# Patient Record
Sex: Male | Born: 1965 | Race: White | Hispanic: No | Smoking: Former smoker
Health system: Southern US, Community
[De-identification: ages and names within clinical notes are randomized; demographics above are authoritative.]

## PROBLEM LIST (undated history)

## (undated) DIAGNOSIS — S52609A Unspecified fracture of lower end of unspecified ulna, initial encounter for closed fracture: Secondary | ICD-10-CM

## (undated) DIAGNOSIS — S069X9A Unspecified intracranial injury with loss of consciousness of unspecified duration, initial encounter: Secondary | ICD-10-CM

## (undated) DIAGNOSIS — S92009A Unspecified fracture of unspecified calcaneus, initial encounter for closed fracture: Secondary | ICD-10-CM

## (undated) DIAGNOSIS — S2239XA Fracture of one rib, unspecified side, initial encounter for closed fracture: Secondary | ICD-10-CM

## (undated) DIAGNOSIS — S52509A Unspecified fracture of the lower end of unspecified radius, initial encounter for closed fracture: Secondary | ICD-10-CM

## (undated) HISTORY — PX: EYE SURGERY: SHX253

## (undated) HISTORY — PX: OTHER SURGICAL HISTORY: SHX169

## (undated) HISTORY — PX: NASAL FRACTURE SURGERY: SHX718

---

## 2011-12-13 ENCOUNTER — Inpatient Hospital Stay (HOSPITAL_COMMUNITY)
Admission: EM | Admit: 2011-12-13 | Discharge: 2011-12-15 | DRG: 086 | Disposition: A | Discharge status: Home / Self Care | Payer: Self-pay

## 2011-12-13 ENCOUNTER — Emergency Department (HOSPITAL_COMMUNITY): Payer: Self-pay

## 2011-12-13 ENCOUNTER — Encounter (HOSPITAL_COMMUNITY): Payer: Self-pay | Admitting: *Deleted

## 2011-12-13 DIAGNOSIS — S069X9A Unspecified intracranial injury with loss of consciousness of unspecified duration, initial encounter: Secondary | ICD-10-CM

## 2011-12-13 DIAGNOSIS — S0990XA Unspecified injury of head, initial encounter: Secondary | ICD-10-CM

## 2011-12-13 DIAGNOSIS — Y93H3 Activity, building and construction: Secondary | ICD-10-CM

## 2011-12-13 DIAGNOSIS — S2241XA Multiple fractures of ribs, right side, initial encounter for closed fracture: Secondary | ICD-10-CM | POA: Diagnosis present

## 2011-12-13 DIAGNOSIS — S62109A Fracture of unspecified carpal bone, unspecified wrist, initial encounter for closed fracture: Secondary | ICD-10-CM

## 2011-12-13 DIAGNOSIS — S52509A Unspecified fracture of the lower end of unspecified radius, initial encounter for closed fracture: Secondary | ICD-10-CM

## 2011-12-13 DIAGNOSIS — S52602A Unspecified fracture of lower end of left ulna, initial encounter for closed fracture: Secondary | ICD-10-CM | POA: Diagnosis present

## 2011-12-13 DIAGNOSIS — W19XXXA Unspecified fall, initial encounter: Secondary | ICD-10-CM

## 2011-12-13 DIAGNOSIS — W1789XA Other fall from one level to another, initial encounter: Secondary | ICD-10-CM | POA: Diagnosis present

## 2011-12-13 DIAGNOSIS — S52502A Unspecified fracture of the lower end of left radius, initial encounter for closed fracture: Secondary | ICD-10-CM | POA: Diagnosis present

## 2011-12-13 DIAGNOSIS — S069XAA Unspecified intracranial injury with loss of consciousness status unknown, initial encounter: Secondary | ICD-10-CM | POA: Diagnosis present

## 2011-12-13 DIAGNOSIS — S2249XA Multiple fractures of ribs, unspecified side, initial encounter for closed fracture: Secondary | ICD-10-CM | POA: Diagnosis present

## 2011-12-13 DIAGNOSIS — S2239XA Fracture of one rib, unspecified side, initial encounter for closed fracture: Secondary | ICD-10-CM

## 2011-12-13 DIAGNOSIS — Y92009 Unspecified place in unspecified non-institutional (private) residence as the place of occurrence of the external cause: Secondary | ICD-10-CM

## 2011-12-13 DIAGNOSIS — S01111A Laceration without foreign body of right eyelid and periocular area, initial encounter: Secondary | ICD-10-CM | POA: Diagnosis present

## 2011-12-13 DIAGNOSIS — Y998 Other external cause status: Secondary | ICD-10-CM

## 2011-12-13 DIAGNOSIS — S0180XA Unspecified open wound of other part of head, initial encounter: Secondary | ICD-10-CM

## 2011-12-13 DIAGNOSIS — S0280XA Fracture of other specified skull and facial bones, unspecified side, initial encounter for closed fracture: Secondary | ICD-10-CM | POA: Diagnosis present

## 2011-12-13 DIAGNOSIS — S0101XA Laceration without foreign body of scalp, initial encounter: Secondary | ICD-10-CM

## 2011-12-13 DIAGNOSIS — S52609A Unspecified fracture of lower end of unspecified ulna, initial encounter for closed fracture: Secondary | ICD-10-CM | POA: Diagnosis present

## 2011-12-13 HISTORY — DX: Unspecified fracture of the lower end of unspecified radius, initial encounter for closed fracture: S52.509A

## 2011-12-13 HISTORY — DX: Multiple fractures of ribs, unspecified side, initial encounter for closed fracture: S22.49XA

## 2011-12-13 HISTORY — DX: Unspecified intracranial injury with loss of consciousness of unspecified duration, initial encounter: S06.9X9A

## 2011-12-13 LAB — COMPREHENSIVE METABOLIC PANEL
ALT: 124 U/L — ABNORMAL HIGH (ref 0–53)
Alkaline Phosphatase: 48 U/L (ref 39–117)
BUN: 23 mg/dL (ref 6–23)
CO2: 27 mEq/L (ref 19–32)
Chloride: 101 mEq/L (ref 96–112)
GFR calc Af Amer: >90 mL/min (ref >90)
GFR calc non Af Amer: 83 mL/min — ABNORMAL LOW (ref >90)
Glucose, Bld: 138 mg/dL — ABNORMAL HIGH (ref 70–99)
Potassium: 3.5 mEq/L (ref 3.5–5.1)
Sodium: 139 mEq/L (ref 135–145)
Total Bilirubin: 0.7 mg/dL (ref 0.3–1.2)
Total Protein: 7.4 g/dL (ref 6.0–8.3)

## 2011-12-13 LAB — POCT I-STAT, CHEM 8
Calcium, Ion: 1.15 mmol/L (ref 1.12–1.32)
Chloride: 103 mEq/L (ref 96–112)
Glucose, Bld: 138 mg/dL — ABNORMAL HIGH (ref 70–99)
HCT: 46 % (ref 39.0–52.0)
TCO2: 27 mmol/L (ref 0–100)

## 2011-12-13 LAB — URINALYSIS, ROUTINE W REFLEX MICROSCOPIC
Bilirubin Urine: NEGATIVE
Ketones, ur: NEGATIVE mg/dL
Nitrite: NEGATIVE
Protein, ur: NEGATIVE mg/dL
pH: 6 (ref 5.0–8.0)

## 2011-12-13 LAB — DIFFERENTIAL
Lymphocytes Relative: 23 % (ref 12–46)
Lymphs Abs: 1.7 10*3/uL (ref 0.7–4.0)
Monocytes Relative: 5 % (ref 3–12)
Neutro Abs: 5.4 10*3/uL (ref 1.7–7.7)
Neutrophils Relative %: 72 % (ref 43–77)

## 2011-12-13 LAB — CBC
Hemoglobin: 14.8 g/dL (ref 13.0–17.0)
Platelets: 226 10*3/uL (ref 150–400)
RBC: 4.67 MIL/uL (ref 4.22–5.81)
WBC: 7.6 10*3/uL (ref 4.0–10.5)

## 2011-12-13 LAB — CARDIAC PANEL(CRET KIN+CKTOT+MB+TROPI)
CK, MB: 13.9 ng/mL (ref 0.3–4.0)
Troponin I: <0.3 ng/mL (ref <0.30)

## 2011-12-13 MED ORDER — POTASSIUM CHLORIDE IN NACL 20-0.9 MEQ/L-% IV SOLN
INTRAVENOUS | Status: DC
  Administered 2011-12-13: 22:00:00 via INTRAVENOUS
  Filled 2011-12-13 (×3): qty 1000

## 2011-12-13 MED ORDER — PANTOPRAZOLE SODIUM 40 MG PO TBEC
40.0000 mg | DELAYED_RELEASE_TABLET | Freq: Every day | ORAL | Status: DC
  Administered 2011-12-14: 40 mg via ORAL
  Filled 2011-12-13: qty 1

## 2011-12-13 MED ORDER — OXYCODONE HCL 5 MG PO TABS
5.0000 mg | ORAL_TABLET | ORAL | Status: DC | PRN
  Administered 2011-12-13 – 2011-12-14 (×3): 5 mg via ORAL
  Filled 2011-12-13: qty 2
  Filled 2011-12-13 (×3): qty 1

## 2011-12-13 MED ORDER — ONDANSETRON HCL 4 MG/2ML IJ SOLN: 4.0000 mg | Freq: Four times a day (QID) | INTRAMUSCULAR | Status: DC | PRN

## 2011-12-13 MED ORDER — ONDANSETRON HCL 4 MG PO TABS: 4.0000 mg | ORAL_TABLET | Freq: Four times a day (QID) | ORAL | Status: DC | PRN

## 2011-12-13 MED ORDER — HYDROMORPHONE HCL PF 1 MG/ML IJ SOLN
1.0000 mg | INTRAMUSCULAR | Status: DC | PRN
  Administered 2011-12-13: 1 mg via INTRAVENOUS
  Filled 2011-12-13: qty 1

## 2011-12-13 MED ORDER — ONDANSETRON HCL 4 MG/2ML IJ SOLN
4.0000 mg | Freq: Once | INTRAMUSCULAR | Status: AC
  Administered 2011-12-13: 4 mg via INTRAVENOUS
  Filled 2011-12-13: qty 2

## 2011-12-13 MED ORDER — VITAMIN C 500 MG PO TABS: 1000.0000 mg | ORAL_TABLET | Freq: Every day | ORAL | Status: DC

## 2011-12-13 MED ORDER — ADULT MULTIVITAMIN W/MINERALS CH
1.0000 | ORAL_TABLET | Freq: Every day | ORAL | Status: DC
  Administered 2011-12-14 – 2011-12-15 (×2): 1 via ORAL
  Filled 2011-12-13 (×2): qty 1

## 2011-12-13 MED ORDER — OXYCODONE HCL 5 MG PO TABS
10.0000 mg | ORAL_TABLET | ORAL | Status: DC | PRN
  Administered 2011-12-14 (×3): 10 mg via ORAL
  Filled 2011-12-13 (×2): qty 2
  Filled 2011-12-13: qty 1

## 2011-12-13 MED ORDER — PANTOPRAZOLE SODIUM 40 MG IV SOLR
40.0000 mg | Freq: Every day | INTRAVENOUS | Status: DC
  Administered 2011-12-13: 40 mg via INTRAVENOUS
  Filled 2011-12-13 (×2): qty 40

## 2011-12-13 MED ORDER — IOHEXOL 300 MG/ML  SOLN
100.0000 mL | Freq: Once | INTRAMUSCULAR | Status: AC | PRN
  Administered 2011-12-13: 100 mL via INTRAVENOUS

## 2011-12-13 MED ORDER — CEFAZOLIN SODIUM 1-5 GM-% IV SOLN
INTRAVENOUS | Status: AC
  Filled 2011-12-13: qty 50

## 2011-12-13 MED ORDER — CEFAZOLIN SODIUM 1-5 GM-% IV SOLN
1.0000 g | Freq: Once | INTRAVENOUS | Status: AC
  Administered 2011-12-13: 1 g via INTRAVENOUS

## 2011-12-13 MED ORDER — DOCUSATE SODIUM 100 MG PO CAPS
100.0000 mg | ORAL_CAPSULE | Freq: Two times a day (BID) | ORAL | Status: DC
  Administered 2011-12-13 – 2011-12-15 (×3): 100 mg via ORAL
  Filled 2011-12-13 (×6): qty 1

## 2011-12-13 MED ORDER — SODIUM CHLORIDE 0.9 % IV BOLUS (SEPSIS)
1000.0000 mL | Freq: Once | INTRAVENOUS | Status: AC
  Administered 2011-12-13: 1000 mL via INTRAVENOUS

## 2011-12-13 MED ORDER — OXYCODONE HCL 5 MG PO TABS: 2.5000 mg | ORAL_TABLET | ORAL | Status: DC | PRN

## 2011-12-13 MED ORDER — VITAMIN C 500 MG PO TABS
1000.0000 mg | ORAL_TABLET | Freq: Every day | ORAL | Status: DC
  Administered 2011-12-14 – 2011-12-15 (×2): 1000 mg via ORAL
  Filled 2011-12-13 (×2): qty 2

## 2011-12-13 MED ORDER — LIDOCAINE-EPINEPHRINE 1 %-1:100000 IJ SOLN
INTRAMUSCULAR | Status: AC
  Filled 2011-12-13: qty 1

## 2011-12-13 MED ORDER — HYDROMORPHONE HCL PF 1 MG/ML IJ SOLN
1.0000 mg | Freq: Once | INTRAMUSCULAR | Status: AC
  Administered 2011-12-13: 1 mg via INTRAVENOUS
  Filled 2011-12-13: qty 1

## 2011-12-13 NOTE — ED Notes (Signed)
Patient transported to CT 
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
## 2011-12-13 NOTE — Progress Notes (Signed)
Orthopedic Tech Progress Note Patient Details:  [name] 07-Jun-1966 [id number]  Type of Splint: Volar Splint Location: left wrist Splint Interventions: Application    Elija Mccamish T 12/13/2011, 1:49 PM
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
## 2011-12-13 NOTE — ED Notes (Signed)
Pt being sutured
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
## 2011-12-13 NOTE — ED Notes (Signed)
[id number] Ready 

## 2011-12-13 NOTE — Progress Notes (Signed)
Responded to page to provide emotional support to patient. Encouraged pt. And reinforced his faith. will follw as needed.   12/13/11 1500  Clinical Encounter Type  Visited With Patient;Health care provider  Visit Type Spiritual support;ED  Referral From Nurse  Spiritual Encounters  Spiritual Needs Emotional  Stress Factors  Patient Stress Factors Exhausted
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
## 2011-12-13 NOTE — ED Notes (Signed)
Returned from mri 
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
## 2011-12-13 NOTE — H&P (Signed)
Agree with above, will re ct in am

## 2011-12-14 ENCOUNTER — Encounter (HOSPITAL_COMMUNITY): Payer: Self-pay | Admitting: Radiology

## 2011-12-14 ENCOUNTER — Inpatient Hospital Stay (HOSPITAL_COMMUNITY): Payer: Self-pay

## 2011-12-14 LAB — CBC
HCT: 36.2 % — ABNORMAL LOW (ref 39.0–52.0)
Hemoglobin: 12.7 g/dL — ABNORMAL LOW (ref 13.0–17.0)
MCH: 31.1 pg (ref 26.0–34.0)
MCHC: 35.1 g/dL (ref 30.0–36.0)

## 2011-12-14 LAB — BASIC METABOLIC PANEL
BUN: 13 mg/dL (ref 6–23)
Calcium: 8.4 mg/dL (ref 8.4–10.5)
GFR calc non Af Amer: 85 mL/min — ABNORMAL LOW (ref >90)
Glucose, Bld: 117 mg/dL — ABNORMAL HIGH (ref 70–99)

## 2011-12-14 MED ORDER — ENOXAPARIN SODIUM 40 MG/0.4ML ~~LOC~~ SOLN
40.0000 mg | SUBCUTANEOUS | Status: DC
  Administered 2011-12-14 – 2011-12-15 (×2): 40 mg via SUBCUTANEOUS
  Filled 2011-12-14 (×2): qty 0.4
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
## 2011-12-14 NOTE — Progress Notes (Signed)
UR of chart complete.  
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
## 2011-12-15 ENCOUNTER — Inpatient Hospital Stay (HOSPITAL_COMMUNITY): Payer: Self-pay

## 2011-12-15 LAB — CBC
HCT: 38.4 % — ABNORMAL LOW (ref 39.0–52.0)
MCH: 30.9 pg (ref 26.0–34.0)
MCV: 89.9 fL (ref 78.0–100.0)
RBC: 4.27 MIL/uL (ref 4.22–5.81)
WBC: 7 10*3/uL (ref 4.0–10.5)

## 2011-12-15 MED ORDER — OXYCODONE-ACETAMINOPHEN 5-325 MG PO TABS: 1.0000 | ORAL_TABLET | ORAL | Status: DC | PRN

## 2011-12-15 MED ORDER — METHOCARBAMOL 500 MG PO TABS: 500.0000 mg | ORAL_TABLET | Freq: Four times a day (QID) | ORAL | Status: DC | PRN

## 2011-12-15 NOTE — Progress Notes (Signed)
Pt discharged home with family. Discussed discharge instructions with pt including how and when to call the dr, follow up appts, wound care, medications to take at home, activity restrictions, and signs and symptoms of infx. Pt verbalized understanding and denied any questions. Prescription for Percocet given and pt acknowledged receipt.

## 2011-12-15 NOTE — Progress Notes (Addendum)
Clinical Social Work Department BRIEF PSYCHOSOCIAL ASSESSMENT 12/15/2011  Patient:  [name], [name]     Account Number:  [id number]     Admit date:  12/13/2011  Clinical Social Worker:  Pearson Forster  Date/Time:  12/15/2011 10:40 AM  Referred by:  Physician  Date Referred:  12/15/2011 Referred for  Other - See comment   Other Referral:   SBIRT completion per trauma protocol   Interview type:  Patient Other interview type:   Patient family at bedside    PSYCHOSOCIAL DATA Living Status:  FAMILY Admitted from facility:   Level of care:   Primary support name:  Kroenke,Bill [phone] Primary support relationship to patient:  PARENT Degree of support available:   Strong    CURRENT CONCERNS Current Concerns  Other - See comment   Other Concerns:   SBIRT Completion    SOCIAL WORK ASSESSMENT / PLAN Clinical Social Worker spoke with patient and patient's father at the bediside to discuss plans and offer support. Per patient and pt's father, patient plans to return home where he lives with a roommate who will be able to provide support as needed and also patient's father will be around to check in with patient as well.    Clinical Social Worker completed SBIRT with patient.  No current alcohol concerns.    No further social work needs.  CSW signing off.   Assessment/plan status:  No Further Intervention Required Other assessment/ plan:   Information/referral to community resources:   none at this time    PATIENT'S/FAMILY'S RESPONSE TO PLAN OF CARE: Patient was alert and oriented x3.  Both patient and patient's father were at the bedside.  Patient stated that he had adequate support at home and patient's father confirmed that he would check in with patient as to offer support.    Arnette Norris, MSW Intern  [place], [place] [phone]

## 2011-12-15 NOTE — Discharge Instructions (Signed)
Keep hand elevated  No weight on left wrist Keep splint clean and dry  No driving while on oxycodone. Wash facial wound daily with soap and water. Apply antibiotic ointment (e.g. Neosporin) twice daily and as needed to keep moist.

## 2011-12-15 NOTE — Discharge Summary (Signed)
Agree with above. Home today.    Wilmon Arms. Corliss Skains, MD, [facility]  12/15/2011 10:41 AM

## 2011-12-15 NOTE — Progress Notes (Signed)
Discharge today.  [name]. Corliss Skains, MD, [facility]  12/15/2011 10:40 AM
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
## 2011-12-15 NOTE — Progress Notes (Signed)
Patient ID: [name], male   DOB: 1966/02/09, 46 y.o.   MRN: [id number]   LOS: 2 days   Subjective: Doing well, pain controlled. Ready to go home.  Objective: Vital signs in last 24 hours: Temp:  [98.2 F (36.8 C)-101.1 F (38.4 C)] 98.2 F (36.8 C) (03/22 0543) Pulse Rate:  [79-89] 79  (03/22 0543) Resp:  [10-18] 18  (03/22 0543) BP: (119-140)/(59-83) 128/83 mmHg (03/22 0543) SpO2:  [95 %-97 %] 97 % (03/22 0543) Last BM Date: 12/13/11  Lab Results:  CBC  Basename 12/15/11 0514 12/14/11 0425  WBC 7.0 7.7  HGB 13.2 12.7*  HCT 38.4* 36.2*  PLT 179 172   *RADIOLOGY REPORT*  Clinical Data: Pain, right rib fractures  PORTABLE CHEST - 1 VIEW  Comparison: 12/13/2011  Findings: Slightly decreased lung volumes but negative for effusion  or pneumothorax. Nondisplaced right rib fractures are visible by  CT but not plain radiography. Stable heart size and vascularity.  Minimal right base atelectasis. Trachea midline.  IMPRESSION:  Minimal right base atelectasis. Low lung volumes.  No pneumothorax.  Original Report Authenticated By: Judie Petit. Ruel Favors, M.D.  General appearance: alert and no distress Resp: clear to auscultation bilaterally Cardio: regular rate and rhythm GI: normal findings: bowel sounds normal and soft, non-tender Incision/Wound:Facial wound C/D/I  Assessment/Plan: Fall from roof  Multiple R rib Fx - pulmonary toilet, IS  L distal radius Fx - outpatient F/U with Dr. Orlan Leavens  Facial lac  Small R lateral orbit Fx nondisplaced  Dispo -- Home     Freeman Caldron, PA-C Pager: [phone] General Trauma PA Pager: [phone]   12/15/2011

## 2011-12-16 ENCOUNTER — Encounter (HOSPITAL_COMMUNITY): Payer: Self-pay | Admitting: *Deleted

## 2011-12-18 ENCOUNTER — Ambulatory Visit
Admission: RE | Admit: 2011-12-18 | Discharge: 2011-12-18 | Disposition: A | Discharge status: Home / Self Care | Payer: Self-pay | Source: Ambulatory Visit | Attending: Orthopedic Surgery | Admitting: Orthopedic Surgery

## 2011-12-18 ENCOUNTER — Other Ambulatory Visit: Payer: Self-pay | Admitting: Orthopedic Surgery

## 2011-12-18 DIAGNOSIS — T148XXA Other injury of unspecified body region, initial encounter: Secondary | ICD-10-CM

## 2011-12-19 ENCOUNTER — Other Ambulatory Visit: Payer: Self-pay | Admitting: Orthopedic Surgery

## 2011-12-19 ENCOUNTER — Encounter (HOSPITAL_COMMUNITY): Payer: Self-pay | Admitting: *Deleted

## 2011-12-19 ENCOUNTER — Other Ambulatory Visit (HOSPITAL_COMMUNITY): Payer: Self-pay | Admitting: *Deleted

## 2011-12-19 NOTE — Progress Notes (Addendum)
I called Dr Ortmann's office and requested orders for surgery. 

## 2011-12-20 ENCOUNTER — Encounter (HOSPITAL_COMMUNITY): Payer: Self-pay | Admitting: *Deleted

## 2011-12-20 ENCOUNTER — Encounter (HOSPITAL_COMMUNITY)
Admission: RE | Disposition: A | Discharge status: Home / Self Care | Payer: Self-pay | Source: Ambulatory Visit | Attending: Orthopedic Surgery

## 2011-12-20 ENCOUNTER — Ambulatory Visit (HOSPITAL_COMMUNITY): Payer: Self-pay | Admitting: Certified Registered"

## 2011-12-20 ENCOUNTER — Encounter (HOSPITAL_COMMUNITY): Payer: Self-pay | Admitting: Certified Registered"

## 2011-12-20 ENCOUNTER — Ambulatory Visit (HOSPITAL_COMMUNITY)
Admission: RE | Admit: 2011-12-20 | Discharge: 2011-12-21 | Disposition: A | Discharge status: Home / Self Care | Payer: Self-pay | Source: Ambulatory Visit | Attending: Orthopedic Surgery | Admitting: Orthopedic Surgery

## 2011-12-20 ENCOUNTER — Encounter (HOSPITAL_COMMUNITY): Payer: Self-pay | Admitting: Pharmacy Technician

## 2011-12-20 DIAGNOSIS — S52599A Other fractures of lower end of unspecified radius, initial encounter for closed fracture: Secondary | ICD-10-CM | POA: Insufficient documentation

## 2011-12-20 DIAGNOSIS — W1789XA Other fall from one level to another, initial encounter: Secondary | ICD-10-CM | POA: Insufficient documentation

## 2011-12-20 HISTORY — DX: Fracture of one rib, unspecified side, initial encounter for closed fracture: S22.39XA

## 2011-12-20 HISTORY — DX: Unspecified intracranial injury with loss of consciousness of unspecified duration, initial encounter: S06.9X9A

## 2011-12-20 HISTORY — DX: Unspecified fracture of lower end of unspecified ulna, initial encounter for closed fracture: S52.609A

## 2011-12-20 HISTORY — DX: Unspecified fracture of unspecified calcaneus, initial encounter for closed fracture: S92.009A

## 2011-12-20 HISTORY — PX: CAST APPLICATION: SHX380

## 2011-12-20 HISTORY — PX: ORIF WRIST FRACTURE: SHX2133

## 2011-12-20 HISTORY — DX: Unspecified fracture of the lower end of unspecified radius, initial encounter for closed fracture: S52.509A

## 2011-12-20 SURGERY — OPEN REDUCTION INTERNAL FIXATION (ORIF) WRIST FRACTURE
Anesthesia: General | Site: Wrist | Laterality: Left | Wound class: Clean

## 2011-12-20 MED ORDER — ZOLPIDEM TARTRATE 5 MG PO TABS: 5.0000 mg | ORAL_TABLET | Freq: Every evening | ORAL | Status: DC | PRN

## 2011-12-20 MED ORDER — MIDAZOLAM HCL 5 MG/5ML IJ SOLN
INTRAMUSCULAR | Status: DC | PRN
  Administered 2011-12-20: 2 mg via INTRAVENOUS

## 2011-12-20 MED ORDER — BUPIVACAINE HCL (PF) 0.25 % IJ SOLN
INTRAMUSCULAR | Status: DC | PRN
  Administered 2011-12-20: 10 mL

## 2011-12-20 MED ORDER — CEFAZOLIN SODIUM 1-5 GM-% IV SOLN: 1.0000 g | INTRAVENOUS | Status: DC

## 2011-12-20 MED ORDER — ONDANSETRON HCL 4 MG/2ML IJ SOLN
INTRAMUSCULAR | Status: DC | PRN
  Administered 2011-12-20: 4 mg via INTRAVENOUS

## 2011-12-20 MED ORDER — LACTATED RINGERS IV SOLN: INTRAVENOUS | Status: DC

## 2011-12-20 MED ORDER — LACTATED RINGERS IV SOLN
INTRAVENOUS | Status: DC | PRN
  Administered 2011-12-20: 15:00:00 via INTRAVENOUS

## 2011-12-20 MED ORDER — ALPRAZOLAM 0.5 MG PO TABS: 0.5000 mg | ORAL_TABLET | Freq: Four times a day (QID) | ORAL | Status: DC | PRN

## 2011-12-20 MED ORDER — OXYCODONE HCL 5 MG PO TABS: 5.0000 mg | ORAL_TABLET | ORAL | Status: DC | PRN

## 2011-12-20 MED ORDER — VITAMIN C 500 MG PO TABS
1000.0000 mg | ORAL_TABLET | Freq: Every day | ORAL | Status: DC
  Administered 2011-12-21: 1000 mg via ORAL
  Filled 2011-12-20: qty 2

## 2011-12-20 MED ORDER — ONDANSETRON HCL 4 MG/2ML IJ SOLN: 4.0000 mg | Freq: Once | INTRAMUSCULAR | Status: DC | PRN

## 2011-12-20 MED ORDER — LACTATED RINGERS IV SOLN
INTRAVENOUS | Status: DC
  Administered 2011-12-20: 15:00:00 via INTRAVENOUS

## 2011-12-20 MED ORDER — METHOCARBAMOL 100 MG/ML IJ SOLN
500.0000 mg | INTRAMUSCULAR | Status: AC
  Administered 2011-12-20: 500 mg via INTRAVENOUS
  Filled 2011-12-20: qty 5

## 2011-12-20 MED ORDER — KCL IN DEXTROSE-NACL 20-5-0.45 MEQ/L-%-% IV SOLN
INTRAVENOUS | Status: AC
  Filled 2011-12-20: qty 1000

## 2011-12-20 MED ORDER — METHOCARBAMOL 100 MG/ML IJ SOLN
500.0000 mg | Freq: Four times a day (QID) | INTRAVENOUS | Status: DC | PRN
  Filled 2011-12-20: qty 5

## 2011-12-20 MED ORDER — METHOCARBAMOL 500 MG PO TABS
500.0000 mg | ORAL_TABLET | Freq: Four times a day (QID) | ORAL | Status: DC | PRN
  Administered 2011-12-20 – 2011-12-21 (×2): 500 mg via ORAL
  Filled 2011-12-20 (×2): qty 1

## 2011-12-20 MED ORDER — 0.9 % SODIUM CHLORIDE (POUR BTL) OPTIME
TOPICAL | Status: DC | PRN
  Administered 2011-12-20: 1000 mL

## 2011-12-20 MED ORDER — DIPHENHYDRAMINE HCL 25 MG PO CAPS: 25.0000 mg | ORAL_CAPSULE | Freq: Four times a day (QID) | ORAL | Status: DC | PRN

## 2011-12-20 MED ORDER — FENTANYL CITRATE 0.05 MG/ML IJ SOLN
INTRAMUSCULAR | Status: DC | PRN
  Administered 2011-12-20: 100 ug via INTRAVENOUS
  Administered 2011-12-20: 25 ug via INTRAVENOUS

## 2011-12-20 MED ORDER — KCL IN DEXTROSE-NACL 20-5-0.45 MEQ/L-%-% IV SOLN
INTRAVENOUS | Status: DC
  Administered 2011-12-20 (×2): via INTRAVENOUS
  Filled 2011-12-20 (×3): qty 1000

## 2011-12-20 MED ORDER — HYDROCODONE-ACETAMINOPHEN 7.5-325 MG PO TABS
1.0000 | ORAL_TABLET | ORAL | Status: DC | PRN
  Administered 2011-12-20 – 2011-12-21 (×5): 2 via ORAL
  Filled 2011-12-20 (×5): qty 2

## 2011-12-20 MED ORDER — CHLORHEXIDINE GLUCONATE 4 % EX LIQD: 60.0000 mL | Freq: Once | CUTANEOUS | Status: DC

## 2011-12-20 MED ORDER — HYDROMORPHONE HCL PF 1 MG/ML IJ SOLN
0.5000 mg | INTRAMUSCULAR | Status: DC | PRN
  Administered 2011-12-20: 1 mg via INTRAVENOUS
  Filled 2011-12-20: qty 1

## 2011-12-20 MED ORDER — CEFAZOLIN SODIUM 1-5 GM-% IV SOLN
1.0000 g | Freq: Three times a day (TID) | INTRAVENOUS | Status: DC
  Administered 2011-12-20 – 2011-12-21 (×3): 1 g via INTRAVENOUS
  Filled 2011-12-20 (×5): qty 50

## 2011-12-20 MED ORDER — CEFAZOLIN SODIUM-DEXTROSE 2-3 GM-% IV SOLR
2.0000 g | INTRAVENOUS | Status: AC
  Administered 2011-12-20: 2 g via INTRAVENOUS

## 2011-12-20 MED ORDER — ONDANSETRON HCL 4 MG/2ML IJ SOLN: 4.0000 mg | Freq: Four times a day (QID) | INTRAMUSCULAR | Status: DC | PRN

## 2011-12-20 MED ORDER — DOCUSATE SODIUM 100 MG PO CAPS
100.0000 mg | ORAL_CAPSULE | Freq: Two times a day (BID) | ORAL | Status: DC
  Administered 2011-12-20 – 2011-12-21 (×2): 100 mg via ORAL
  Filled 2011-12-20 (×3): qty 1

## 2011-12-20 MED ORDER — PROPOFOL 10 MG/ML IV EMUL
INTRAVENOUS | Status: DC | PRN
  Administered 2011-12-20: 200 mg via INTRAVENOUS
  Administered 2011-12-20: 50 mg via INTRAVENOUS

## 2011-12-20 MED ORDER — CEFAZOLIN SODIUM-DEXTROSE 2-3 GM-% IV SOLR
INTRAVENOUS | Status: AC
  Filled 2011-12-20: qty 50

## 2011-12-20 MED ORDER — HYDROMORPHONE HCL PF 1 MG/ML IJ SOLN
0.2500 mg | INTRAMUSCULAR | Status: DC | PRN
  Administered 2011-12-20 (×2): 0.5 mg via INTRAVENOUS

## 2011-12-20 MED ORDER — ONDANSETRON HCL 4 MG PO TABS: 4.0000 mg | ORAL_TABLET | Freq: Four times a day (QID) | ORAL | Status: DC | PRN

## 2011-12-20 SURGICAL SUPPLY — 72 items
1.8 VA-Buttress Peg 26mm ×1 IMPLANT
1.8 VA_Buttress Peg 24mm ×2 IMPLANT
BANDAGE ELASTIC 3 VELCRO ST LF (GAUZE/BANDAGES/DRESSINGS) ×2 IMPLANT
BANDAGE ELASTIC 4 VELCRO ST LF (GAUZE/BANDAGES/DRESSINGS) ×2 IMPLANT
BANDAGE GAUZE ELAST BULKY 4 IN (GAUZE/BANDAGES/DRESSINGS) ×2 IMPLANT
BIT DRILL 100X2XQC STRL (BIT) IMPLANT
BIT DRILL CALIBRATED 1.8MM (BIT) IMPLANT
BIT DRILL QC 2.0X100 (BIT) ×2
BIT DRL 100X2XQC STRL (BIT) ×1
BLADE SURG ROTATE 9660 (MISCELLANEOUS) IMPLANT
BNDG CMPR 9X4 STRL LF SNTH (GAUZE/BANDAGES/DRESSINGS) ×1
BNDG CMPR MD 5X2 ELC HKLP STRL (GAUZE/BANDAGES/DRESSINGS) ×1
BNDG ELASTIC 2 VLCR STRL LF (GAUZE/BANDAGES/DRESSINGS) ×1 IMPLANT
BNDG ESMARK 4X9 LF (GAUZE/BANDAGES/DRESSINGS) ×2 IMPLANT
CLOTH BEACON ORANGE TIMEOUT ST (SAFETY) ×2 IMPLANT
CORDS BIPOLAR (ELECTRODE) ×2 IMPLANT
COVER SURGICAL LIGHT HANDLE (MISCELLANEOUS) ×2 IMPLANT
CUFF TOURNIQUET SINGLE 18IN (TOURNIQUET CUFF) ×2 IMPLANT
CUFF TOURNIQUET SINGLE 24IN (TOURNIQUET CUFF) IMPLANT
DRAIN TLS ROUND 10FR (DRAIN) IMPLANT
DRAPE OEC MINIVIEW 54X84 (DRAPES) ×2 IMPLANT
DRAPE SURG 17X11 SM STRL (DRAPES) ×2 IMPLANT
DRILL CALIBRATED 1.8MM (BIT) ×2
DRSG ADAPTIC 3X8 NADH LF (GAUZE/BANDAGES/DRESSINGS) ×2 IMPLANT
ELECT REM PT RETURN 9FT ADLT (ELECTROSURGICAL)
ELECTRODE REM PT RTRN 9FT ADLT (ELECTROSURGICAL) IMPLANT
GAUZE SPONGE 4X4 16PLY XRAY LF (GAUZE/BANDAGES/DRESSINGS) ×2 IMPLANT
GLOVE BIO SURGEON STRL SZ7.5 (GLOVE) ×1 IMPLANT
GLOVE BIOGEL PI IND STRL 7.0 (GLOVE) IMPLANT
GLOVE BIOGEL PI IND STRL 7.5 (GLOVE) IMPLANT
GLOVE BIOGEL PI IND STRL 8.5 (GLOVE) ×1 IMPLANT
GLOVE BIOGEL PI INDICATOR 7.0 (GLOVE) ×1
GLOVE BIOGEL PI INDICATOR 7.5 (GLOVE) ×1
GLOVE BIOGEL PI INDICATOR 8.5 (GLOVE) ×1
GLOVE ECLIPSE 7.5 STRL STRAW (GLOVE) ×1 IMPLANT
GLOVE SURG ORTHO 8.0 STRL STRW (GLOVE) ×2 IMPLANT
GLOVE SURG SS PI 7.0 STRL IVOR (GLOVE) ×1 IMPLANT
GOWN PREVENTION PLUS XLARGE (GOWN DISPOSABLE) ×4 IMPLANT
GOWN STRL NON-REIN LRG LVL3 (GOWN DISPOSABLE) ×3 IMPLANT
KIT BASIN OR (CUSTOM PROCEDURE TRAY) ×2 IMPLANT
KIT ROOM TURNOVER OR (KITS) ×2 IMPLANT
MANIFOLD NEPTUNE II (INSTRUMENTS) ×2 IMPLANT
NDL HYPO 25X1 1.5 SAFETY (NEEDLE) ×1 IMPLANT
NEEDLE HYPO 25X1 1.5 SAFETY (NEEDLE) ×2 IMPLANT
NS IRRIG 1000ML POUR BTL (IV SOLUTION) ×2 IMPLANT
PACK ORTHO EXTREMITY (CUSTOM PROCEDURE TRAY) ×2 IMPLANT
PAD ARMBOARD 7.5X6 YLW CONV (MISCELLANEOUS) ×4 IMPLANT
PAD CAST 3X4 CTTN HI CHSV (CAST SUPPLIES) IMPLANT
PAD CAST 4YDX4 CTTN HI CHSV (CAST SUPPLIES) ×1 IMPLANT
PADDING CAST COTTON 3X4 STRL (CAST SUPPLIES) ×2
PADDING CAST COTTON 4X4 STRL (CAST SUPPLIES) ×2
PIN BUTTRESS 20MM (Pin) ×2 IMPLANT
PIN BUTTRESS LOCK 1.8X18 (Pin) ×1 IMPLANT
PLATE VA VOLAR LEFT 2CP 7/3H (Plate) ×1 IMPLANT
SCREW SELF TAP 14MM (Screw) ×3 IMPLANT
SOAP 2 % CHG 4 OZ (WOUND CARE) ×2 IMPLANT
SPLINT FIBERGLASS 3X35 (CAST SUPPLIES) ×1 IMPLANT
SPONGE GAUZE 4X4 12PLY (GAUZE/BANDAGES/DRESSINGS) ×2 IMPLANT
STRIP CLOSURE SKIN 1/2X4 (GAUZE/BANDAGES/DRESSINGS) IMPLANT
SUCTION FRAZIER TIP 10 FR DISP (SUCTIONS) ×2 IMPLANT
SUT ETHILON 4 0 PS 2 18 (SUTURE) ×1 IMPLANT
SUT MNCRL AB 4-0 PS2 18 (SUTURE) IMPLANT
SUT PROLENE 3 0 PS 1 (SUTURE) ×1 IMPLANT
SUT VIC AB 2-0 FS1 27 (SUTURE) ×3 IMPLANT
SUT VICRYL 4-0 PS2 18IN ABS (SUTURE) ×3 IMPLANT
SYR CONTROL 10ML LL (SYRINGE) IMPLANT
SYSTEM CHEST DRAIN TLS 7FR (DRAIN) IMPLANT
TOWEL OR 17X24 6PK STRL BLUE (TOWEL DISPOSABLE) ×2 IMPLANT
TOWEL OR 17X26 10 PK STRL BLUE (TOWEL DISPOSABLE) ×2 IMPLANT
TUBE CONNECTING 12X1/4 (SUCTIONS) ×2 IMPLANT
WATER STERILE IRR 1000ML POUR (IV SOLUTION) ×2 IMPLANT
YANKAUER SUCT BULB TIP NO VENT (SUCTIONS) IMPLANT

## 2011-12-20 NOTE — Brief Op Note (Signed)
12/20/2011  6:19 PM  PATIENT:  [name]  46 y.o. male  PRE-OPERATIVE DIAGNOSIS:  left wrist fracture  POST-OPERATIVE DIAGNOSIS:  left wrist fracture  PROCEDURE:  Procedure(s) (LRB): OPEN REDUCTION INTERNAL FIXATION (ORIF) WRIST FRACTURE (Left) CAST APPLICATION (Left)  SURGEON:  Surgeon(s) and Role:    * Sharma Covert, MD - Primary  PHYSICIAN ASSISTANT:   ASSISTANTS: none   ANESTHESIA:   general  EBL:     BLOOD ADMINISTERED:none  DRAINS: none   LOCAL MEDICATIONS USED:  MARCAINE     SPECIMEN:  No Specimen  DISPOSITION OF SPECIMEN:  N/A  COUNTS:  YES  TOURNIQUET:   Total Tourniquet Time Documented: Upper Arm (Left) - 74 minutes  DICTATION: .Other Dictation: Dictation Number [id number]  PLAN OF CARE: Admit for overnight observation  PATIENT DISPOSITION:  PACU - hemodynamically stable.   Delay start of Pharmacological VTE agent (>24hrs) due to surgical blood loss or risk of bleeding: not applicable

## 2011-12-20 NOTE — Preoperative (Signed)
Beta Blockers   Reason not to administer Beta Blockers:Not Applicable 

## 2011-12-20 NOTE — Anesthesia Postprocedure Evaluation (Signed)
Anesthesia Post Note  Patient: [name]  Procedure(s) Performed: Procedure(s) (LRB): OPEN REDUCTION INTERNAL FIXATION (ORIF) WRIST FRACTURE (Left) CAST APPLICATION (Left)  Anesthesia type: general  Patient location: PACU  Post pain: Pain level controlled  Post assessment: Patient's Cardiovascular Status Stable  Last Vitals:  Filed Vitals:   12/20/11 1900  BP: 145/99  Pulse: 76  Temp:   Resp: 17    Post vital signs: Reviewed and stable  Level of consciousness: sedated  Complications: No apparent anesthesia complications

## 2011-12-20 NOTE — Progress Notes (Signed)
Dr. Katrinka Blazing notified of fluid intake. Per Dr. Katrinka Blazing surgery delayed until 3 pm patient notified.

## 2011-12-20 NOTE — Transfer of Care (Signed)
Immediate Anesthesia Transfer of Care Note  Patient: [name]  Procedure(s) Performed: Procedure(s) (LRB): OPEN REDUCTION INTERNAL FIXATION (ORIF) WRIST FRACTURE (Left) CAST APPLICATION (Left)  Patient Location: PACU  Anesthesia Type: General  Level of Consciousness: awake  Airway & Oxygen Therapy: Patient Spontanous Breathing  Post-op Assessment: Report given to PACU RN  Post vital signs: stable  Complications: No apparent anesthesia complications

## 2011-12-20 NOTE — Anesthesia Procedure Notes (Signed)
Procedure Name: LMA Insertion Date/Time: 12/20/2011 4:30 PM Performed by: Ellin Goodie Pre-anesthesia Checklist: Patient identified, Emergency Drugs available, Suction available, Patient being monitored and Timeout performed Patient Re-evaluated:Patient Re-evaluated prior to inductionOxygen Delivery Method: Circle system utilized Preoxygenation: Pre-oxygenation with 100% oxygen Intubation Type: IV induction Ventilation: Mask ventilation without difficulty LMA: LMA inserted LMA Size: 5.0 Number of attempts: 1 Placement Confirmation: positive ETCO2 Tube secured with: Tape Dental Injury: Teeth and Oropharynx as per pre-operative assessment

## 2011-12-20 NOTE — Progress Notes (Signed)
Spoke with Revonda Standard, Pa regarding BMET results on 12/14/11 no need to repeat today.

## 2011-12-20 NOTE — H&P (Signed)
[name] is an 46 y.o. male.   Chief Complaint: LEFT WRIST INJURY AFTER FALL HPI: LEFT WRIST INJURY. PT WITH DISPLACED FRACTURE PT HERE FOR SURGERY PT SEEN AND EVALUATED IN OFFICE  Past Medical History  Diagnosis Date  . Head injury, closed, with LOC of unknown duration 12/13/2011  . Rib fractures 12/13/2011  . Heel fracture   . Radius and ulna distal fracture 12/13/2011    Past Surgical History  Procedure Date  . Eye surgery     Fx repair  . Nasal fracture surgery   . Heel fracture     'Shattered" repair    Family History  Problem Relation Age of Onset  . Anesthesia problems Neg Hx    Social History:  reports that he quit smoking about 6 years ago. His smoking use included Cigarettes. He quit after 11 years of use. He does not have any smokeless tobacco history on file. He reports that he does not drink alcohol or use illicit drugs.  Allergies: No Known Allergies  Medications Prior to Admission  Medication Dose Route Frequency Provider Last Rate Last Dose  . ceFAZolin (ANCEF) 2-3 GM-% IVPB SOLR           . ceFAZolin (ANCEF) IVPB 2 g/50 mL premix  2 g Intravenous 60 min Pre-Op Sharma Covert, MD      . chlorhexidine (HIBICLENS) 4 % liquid 4 application  60 mL Topical Once Sharma Covert, MD      . lactated ringers infusion   Intravenous Continuous Rivka Barbara, MD 50 mL/hr at 12/20/11 1443     Medications Prior to Admission  Medication Sig Dispense Refill  . Ascorbic Acid (VITAMIN C) 1000 MG tablet Take 1,000 mg by mouth daily.      . methocarbamol (ROBAXIN) 500 MG tablet Take 500-1,000 mg by mouth 4 (four) times daily as needed.      . Multiple Vitamin (MULITIVITAMIN WITH MINERALS) TABS Take 1 tablet by mouth daily.      . Omega-3 Fatty Acids (FISH OIL PO) Take 1-3 tablets by mouth 2 (two) times daily. Takes one tablet in the morning and three in the evening      . oxyCODONE-acetaminophen (PERCOCET) 5-325 MG per tablet Take 1-2 tablets by mouth every 4 (four)  hours as needed.        No results found for this or any previous visit (from the past 48 hour(s)). Ct Wrist Left Wo Contrast  12/19/2011  *RADIOLOGY REPORT*  Clinical Data:  Status post fall, status post fall 12/13/2011 with a fracture.  CT LEFT WRIST WITHOUT CONTRAST  Technique:  Multidetector CT imaging of the left wrist was performed according to the standard protocol without intravenous contrast. Multiplanar CT image reconstructions were also generated.  Comparison:  Plain films of the left wrist 12/13/2011.  Findings:  As seen on the patient's plain films, the patient has a comminuted and mildly impacted fracture of the distal radius. Impaction is minimal at 0.2-0.3 cm.  The articular surface of the radius is divided into two main fragments.  There is distraction of up to 0.3 cm in the sagittal plane.  The fracture extends to the Lister's tubercle.  No notable dorsal angulation is seen.  The patient also has a nondisplaced ulnar styloid fracture.  Tiny cysts are seen in the lunate eccentric to the radial side with small cysts identified in the scaphoid and hamate.  These are likely degenerative.  Soft tissue swelling is present about the wrist.  IMPRESSION: Minimally impacted distal radius fracture extends to the articular surface as described above.  Ulnar styloid fracture also noted.  *RADIOLOGY REPORT*  Clinical Data:  Status post fall 12/13/2011 with a fracture.  3-DIMENSIONAL CT IMAGE RENDERING AT INDEPENDENT WORKSTATION:  Technique:  3-dimensional CT images were rendered by post- processing of the original CT data at independent workstation.  The 3-dimensional CT images were interpreted, and findings were reported in the accompanying complete CT report for this study.  Comparison:  Plain films 12/13/2011.  Findings:  Surface rendered 3-D imaging confirms the above findings.  IMPRESSION: As above.  Original Report Authenticated By: Bernadene Bell. Maricela Curet, M.D.   Ct 3d Independent Annabell Sabal  12/19/2011   *RADIOLOGY REPORT*  Clinical Data:  Status post fall, status post fall 12/13/2011 with a fracture.  CT LEFT WRIST WITHOUT CONTRAST  Technique:  Multidetector CT imaging of the left wrist was performed according to the standard protocol without intravenous contrast. Multiplanar CT image reconstructions were also generated.  Comparison:  Plain films of the left wrist 12/13/2011.  Findings:  As seen on the patient's plain films, the patient has a comminuted and mildly impacted fracture of the distal radius. Impaction is minimal at 0.2-0.3 cm.  The articular surface of the radius is divided into two main fragments.  There is distraction of up to 0.3 cm in the sagittal plane.  The fracture extends to the Lister's tubercle.  No notable dorsal angulation is seen.  The patient also has a nondisplaced ulnar styloid fracture.  Tiny cysts are seen in the lunate eccentric to the radial side with small cysts identified in the scaphoid and hamate.  These are likely degenerative.  Soft tissue swelling is present about the wrist.  IMPRESSION: Minimally impacted distal radius fracture extends to the articular surface as described above.  Ulnar styloid fracture also noted.  *RADIOLOGY REPORT*  Clinical Data:  Status post fall 12/13/2011 with a fracture.  3-DIMENSIONAL CT IMAGE RENDERING AT INDEPENDENT WORKSTATION:  Technique:  3-dimensional CT images were rendered by post- processing of the original CT data at independent workstation.  The 3-dimensional CT images were interpreted, and findings were reported in the accompanying complete CT report for this study.  Comparison:  Plain films 12/13/2011.  Findings:  Surface rendered 3-D imaging confirms the above findings.  IMPRESSION: As above.  Original Report Authenticated By: Bernadene Bell. D'ALESSIO, M.D.    NO RECENT ILLNESSES OR HOSPITALIZATIONS  Blood pressure 127/90, pulse 75, temperature 97.9 F (36.6 C), temperature source Oral, resp. rate 20, SpO2 97.00%. General Appearance:   Alert, cooperative, no distress, appears stated age  Head:  Normocephalic, without obvious abnormality, atraumatic  Eyes:  Pupils equal, conjunctiva/corneas clear,         Throat: Lips, mucosa, and tongue normal; teeth and gums normal  Neck: No visible masses     Lungs:   respirations unlabored  Chest Wall:  No tenderness or deformity  Heart:  Regular rate and rhythm,  Abdomen:   Soft, non-tender,         Extremities: LEFT WRIST: SKIN INTACT FINGERS WARM WELL PERFUSED GOOD CAP REFIL GOOD DIGITAL MOTION  ABLE TO EXTEND THUMB   Pulses: 2+ and symmetric  Skin: Skin color, texture, turgor normal, no rashes or lesions     Neurologic: Normal    Assessment/Plan Left wrist intra-articular distal radius fracture displaced  Left wrist open reduction and internal fixation of displaced left wrist fracture  R/B/A DISCUSSED WITH PT IN OFFICE.  PT VOICED UNDERSTANDING  OF PLAN CONSENT SIGNED DAY OF SURGERY PT SEEN AND EXAMINED PRIOR TO OPERATIVE PROCEDURE/DAY OF SURGERY SITE MARKED. QUESTIONS ANSWERED WILL REMAIN AS INPATIENT  FOR IV ABX AND PAIN CONTROL FOLLOWING SURGERY  Sharma Covert 12/20/2011, 3:55 PM

## 2011-12-20 NOTE — Anesthesia Preprocedure Evaluation (Addendum)
Anesthesia Evaluation  Patient identified by MRN, date of birth, ID band Patient awake    Reviewed: Allergy & Precautions, H&P , NPO status , Patient's Chart, lab work & pertinent test results, reviewed documented beta blocker date and time   Airway Mallampati: II TM Distance: >3 FB Neck ROM: Full    Dental  (+) Chipped and Dental Advisory Given   Pulmonary  breath sounds clear to auscultation        Cardiovascular Exercise Tolerance: Good Rhythm:regular Rate:Normal     Neuro/Psych    GI/Hepatic negative GI ROS, Neg liver ROS,   Endo/Other  negative endocrine ROS  Renal/GU negative Renal ROS  negative genitourinary   Musculoskeletal negative musculoskeletal ROS (+)   Abdominal (+)  Abdomen: soft. Bowel sounds: normal.  Peds  Hematology negative hematology ROS (+)   Anesthesia Other Findings   Reproductive/Obstetrics                       Anesthesia Physical Anesthesia Plan  ASA: I  Anesthesia Plan: General   Post-op Pain Management:    Induction: Intravenous  Airway Management Planned: LMA  Additional Equipment:   Intra-op Plan:   Post-operative Plan: Extubation in OR  Informed Consent: I have reviewed the patients History and Physical, chart, labs and discussed the procedure including the risks, benefits and alternatives for the proposed anesthesia with the patient or authorized representative who has indicated his/her understanding and acceptance.     Plan Discussed with: CRNA, Anesthesiologist and Surgeon  Anesthesia Plan Comments:         Anesthesia Quick Evaluation

## 2011-12-21 ENCOUNTER — Encounter (INDEPENDENT_AMBULATORY_CARE_PROVIDER_SITE_OTHER): Payer: Self-pay

## 2011-12-21 MED ORDER — DOCUSATE SODIUM 100 MG PO CAPS: 100.0000 mg | ORAL_CAPSULE | Freq: Two times a day (BID) | ORAL | Status: AC

## 2011-12-21 MED ORDER — OXYCODONE-ACETAMINOPHEN 10-325 MG PO TABS: 1.0000 | ORAL_TABLET | Freq: Four times a day (QID) | ORAL | Status: AC | PRN

## 2011-12-21 MED ORDER — VITAMIN C 500 MG PO TABS: 500.0000 mg | ORAL_TABLET | Freq: Every day | ORAL | Status: AC

## 2011-12-21 NOTE — Op Note (Signed)
NAMEDEVONTAY, [name] NO.:  [id number]  MEDICAL RECORD NO.:  [id number]  LOCATION:  5039                         FACILITY:  [facility]  PHYSICIAN:  Madelynn Done, MD  DATE OF BIRTH:  1966/08/11  DATE OF PROCEDURE:  12/20/2011 DATE OF DISCHARGE:                              OPERATIVE REPORT   PREOPERATIVE DIAGNOSIS:  Left wrist intra-articular distal radius fracture, three or more fragments.  POSTOPERATIVE DIAGNOSIS:  Left wrist intra-articular distal radius fracture, three or more fragments.  ATTENDING PHYSICIAN:  Madelynn Done, MD, who was scrubbed and present for the entire procedure.  ASSISTANT SURGEON:  None.  ANESTHESIA:  General via LMA.  SURGICAL IMPLANT:  Synthes 2-column plate, 7-hole plate with 5 screws distally and 3 screws proximally. Surgical.  SURGICAL PROCEDURE: 1. Open treatment of left wrist intra-articular distal radius     fracture, three or more fragments, internal fixation. 2. Stress radiography, left wrist.  SURGICAL INDICATIONS:  Mr. [name] is a right-hand dominant gentleman who fell off of elevated height and sustaining a closed injury to his left wrist.  The patient seen back in the office and given the degree of displacement, it is recommended the patient undergo the above procedure. Risks, benefits, and alternatives were discussed in detail with the patient and signed informed consent was obtained.  Risks include, but are not limited to bleeding, infection, damage to nearby nerves, arteries, or tendons, nonunion, malunion, hardware failure, loss of motion of the wrist and digits, and need for further surgical intervention.  DESCRIPTION OF PROCEDURE:  The patient was appropriately identified in the preoperative holding area, marked with a permanent marker was made on the left wrist to indicate the correct operative site.  The patient was then brought back to the operating room and placed supine on the anesthesia room  table where general anesthesia was administered.  The patient tolerated this well.  A well-padded tourniquet was then placed on the left brachium, sealed with 1000 drape.  The left upper extremity was then prepped and draped in normal sterile fashion.  Time-out was called, correct side was identified, and procedure was then begun. Attention was then turned to the left wrist where the limb was then elevated and tourniquet insufflated.  Posterior approach was then used in the wrist, and dissection was then carried down through the skin and subcutaneous tissue.  The FCR sheath was then opened proximally and distally and the FCR sheath was incised longitudinally.  Going through the floor of the FCR sheath, the FPL was then identified and carefully retracted.  Pronator quadratus was elevated in an L-shaped fashion.  The fracture site was then exposed.  The patient did have an intra-articular fracture of three or more fragments.  An open reduction was then carried out reducing the fracture fragments nicely.  The Synthes distal radius plate and 2-column plate were then applied to the volar surface of the wrist, held temporarily and placed with the K-wires.  The position was then confirmed using mini C-arm.  Following this, the oblong screw hole was then placed proximally with the 2.0-mm drill bit and 2.7-mm screw. Following this, distal row fixation was then carried out beginning  from an ulnar to radial direction with two port fixation points of the ulnar column, one locking peg and one locking screw.  Attention was then turned radially over 3 or more screws were then placed distally.  Mini C- arm was used to confirm placement of the fixation.  After this, two more bicortical screws were then placed proximally.  The three radial column screws or locking pegs.  The wound was then thoroughly irrigated. Stress radiography was then carried out showing good reduction of the articular surface and good  alignment of the fracture fragments.  The pronator quadratus was then closed with 2-0 Vicryl.  Tourniquet was deflated.  Hemostasis was obtained.  The subcutaneous tissues were closed with 4-0 Vicryl and the skin closed with simple 3-0 Prolene sutures.  Adaptic dressing and sterile compressive bandages were then applied.  The patient was then placed in a well-padded volar splint, extubated, and taken to the recovery room in good condition.  POSTOPERATIVE PLAN:  The patient will be admitted for overnight for IV antibiotics, pain control, discharged in the morning, seen back in the office in approximately 2 weeks for wound check, suture removal, application of short-arm cast for a total of 2 more weeks, and then begin an outpatient therapy regimen.  Radiographs at each visit.     Madelynn Done, MD     FWO/MEDQ  D:  12/20/2011  T:  12/21/2011  Job:  [id number]

## 2011-12-21 NOTE — Progress Notes (Signed)
Pt in stable condition with no changes in status today. Provided with discharge instructions, prescriptions, and follow up information per Dr Melvyn Novas. Pt discharged to home at 1850.

## 2011-12-21 NOTE — Plan of Care (Signed)
Problem: Phase II Progression Outcomes Goal: Return of bowel function (flatus, BM) IF ABDOMINAL SURGERY:  Outcome: Completed/Met Date Met:  12/21/11 Flatus

## 2011-12-21 NOTE — Discharge Instructions (Signed)
KEEP BANDAGE CLEAN AND DRY CALL OFFICE FOR F/U APPT [phone] in 2 weeks KEEP HAND ELEVATED ABOVE HEART OK TO APPLY ICE TO OPERATIVE AREA CONTACT OFFICE IF ANY WORSENING PAIN OR CONCERNS.  
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
## 2011-12-21 NOTE — Progress Notes (Addendum)
Removed sutures from right forehead/eyebrow area and applied steristrips per MD order. Pt. Tolerated well.

## 2011-12-27 ENCOUNTER — Encounter (HOSPITAL_COMMUNITY): Payer: Self-pay | Admitting: Orthopedic Surgery

## 2011-12-27 ENCOUNTER — Encounter (INDEPENDENT_AMBULATORY_CARE_PROVIDER_SITE_OTHER): Payer: Self-pay

## 2011-12-28 ENCOUNTER — Encounter (INDEPENDENT_AMBULATORY_CARE_PROVIDER_SITE_OTHER): Payer: Self-pay | Admitting: Physician Assistant

## 2013-10-15 IMAGING — CT CT ABD-PELV W/ CM
4 of 5 series · 13 of 46 positions shown, 18 images · IV contrast (100ml omni 300)
Comparison: None.

CT CHEST

CLINICAL DATA: Level II trauma, fall approximately 15 feet

CT CHEST, ABDOMEN AND PELVIS WITH CONTRAST
TECHNIQUE: Multidetector CT imaging of the chest, abdomen and
pelvis was performed following the standard protocol during bolus
administration of intravenous contrast.
Contrast: 100mL OMNIPAQUE IOHEXOL 300 MG/ML IJ SOLN

[Series 2: chest/abd/pelvis · axial · 0.73mm/px · z∈[-521,-131]mm · 7 of 118 slices shown]
[im 14/118  soft-tissue]
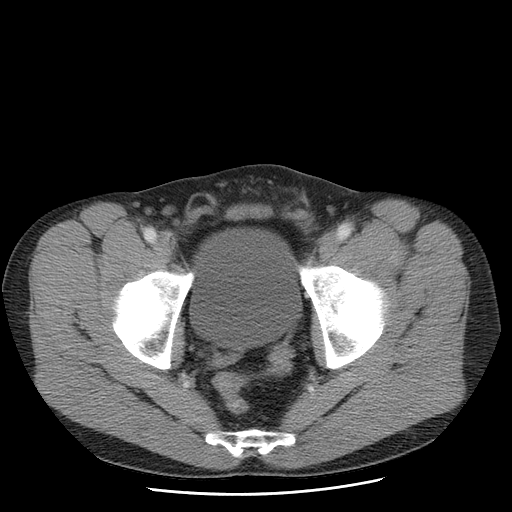
[im 27/118  soft-tissue]
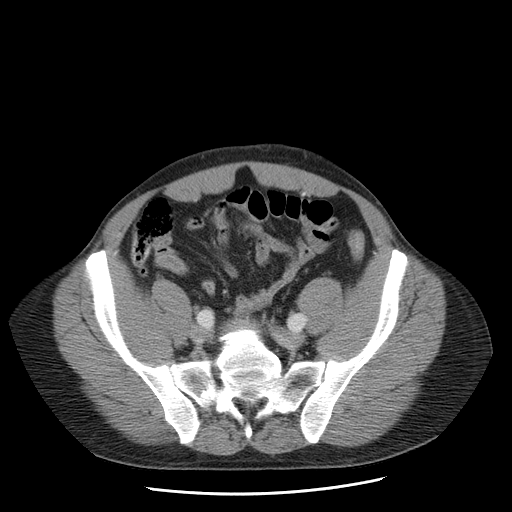
[im 40/118  soft-tissue]
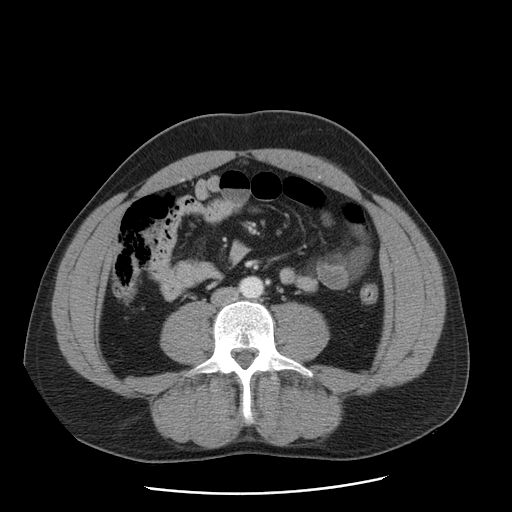
[im 53/118  soft-tissue]
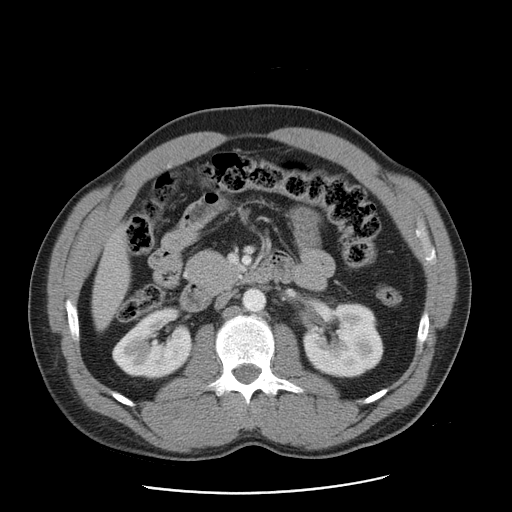
[im 66/118  soft-tissue]
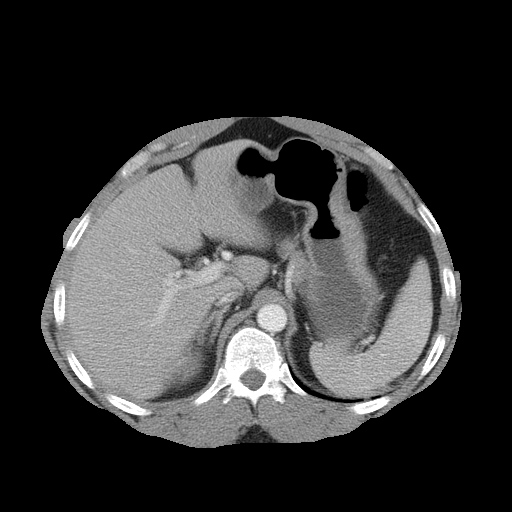
[im 79/118  soft-tissue]
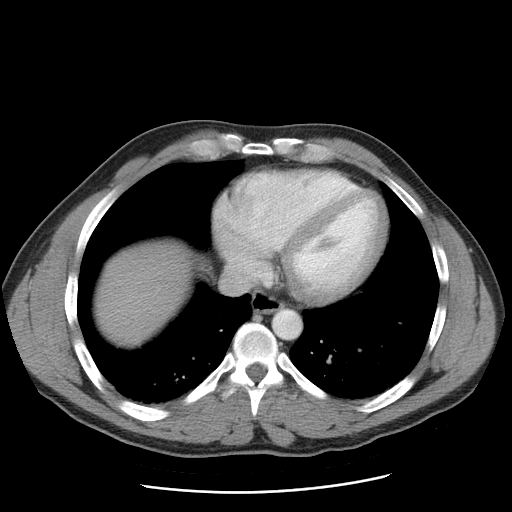
[im 92/118  soft-tissue]
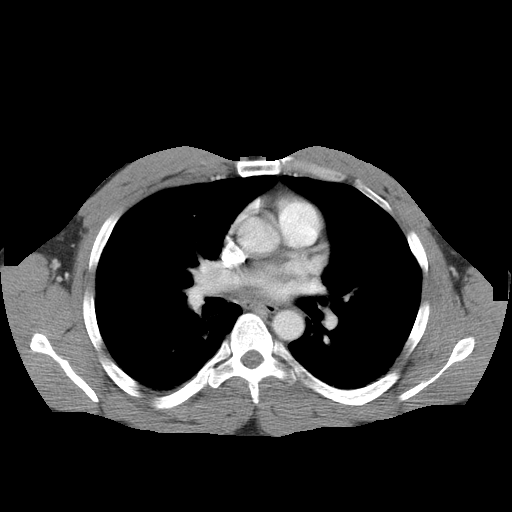

[Series 5: renal delays · axial · 0.74mm/px · z∈[-346,-301]mm · 2 of 27 slices shown, 5 images]
[im 9/27  soft-tissue]
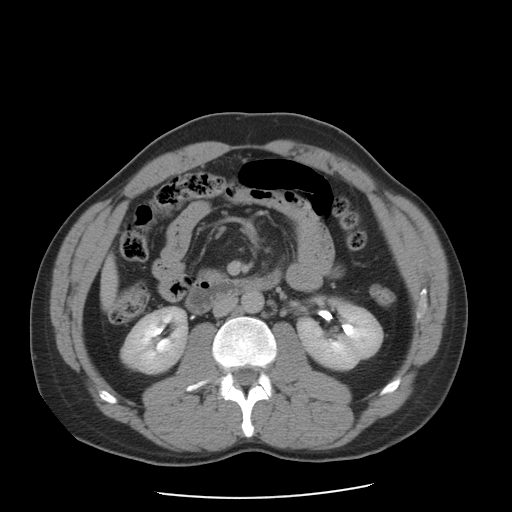
[im 9/27  lung]
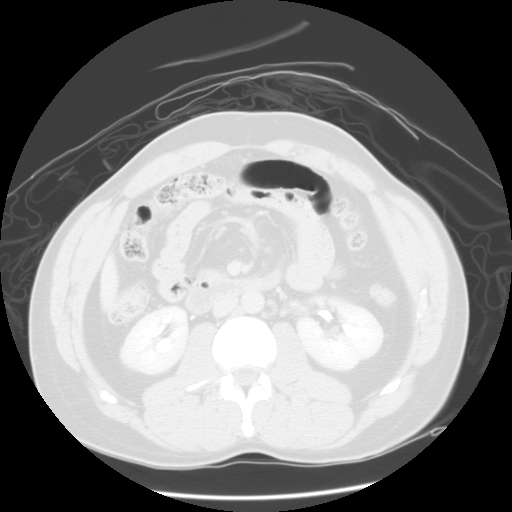
[im 9/27  bone]
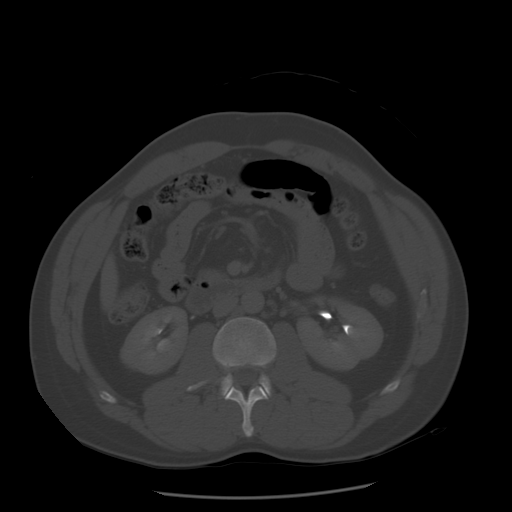
[im 18/27  soft-tissue]
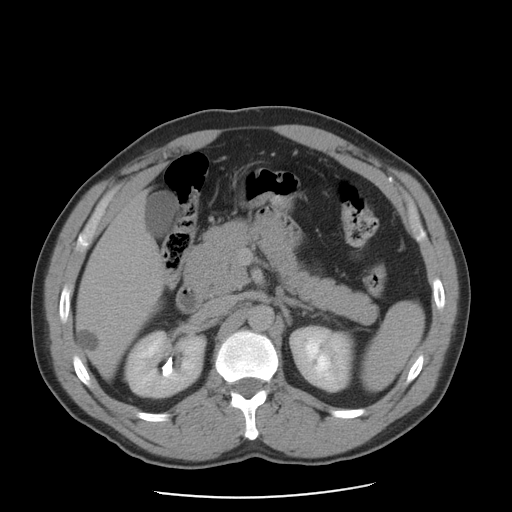
[im 18/27  lung]
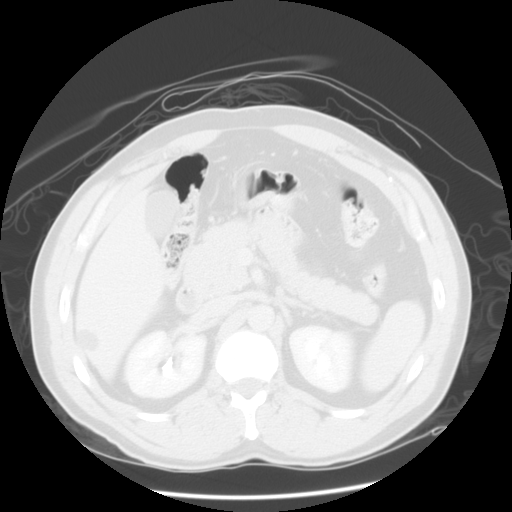

[Series 400: coronals · coronal · 1.21mm/px · 3 of 98 slices shown, 4 images]
[im 33/98  soft-tissue]
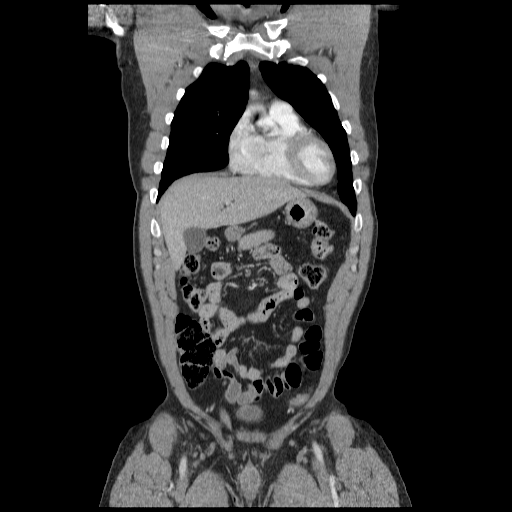
[im 44/98  soft-tissue]
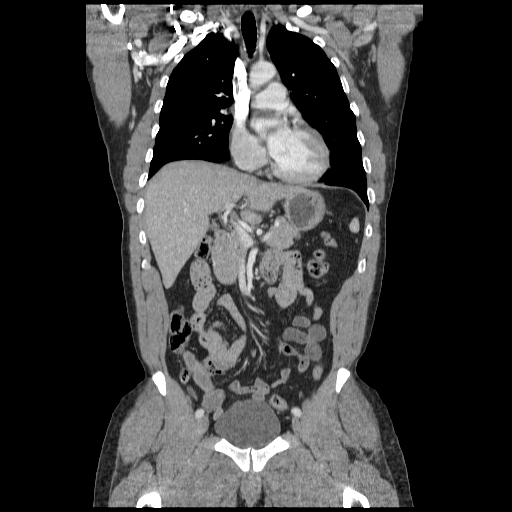
[im 44/98  bone]
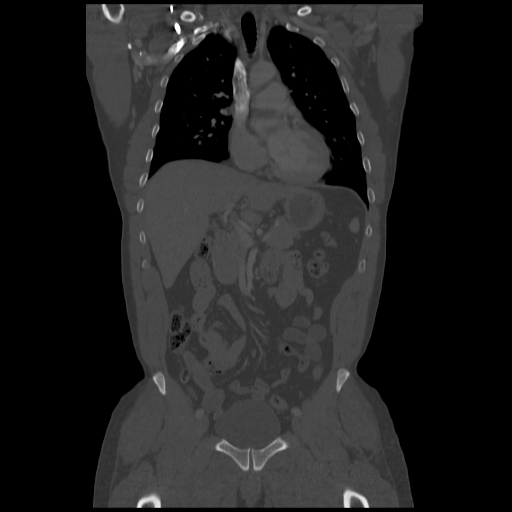
[im 54/98  soft-tissue]
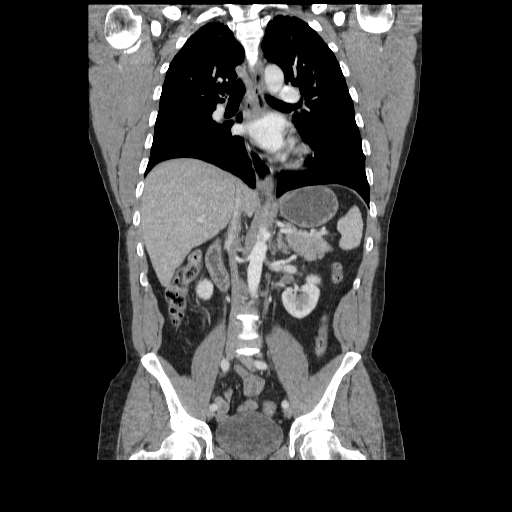

[Series 401: sagittals · sagittal · 1.21mm/px · 1 of 129 slices shown, 2 images]
[im 43/129  soft-tissue]
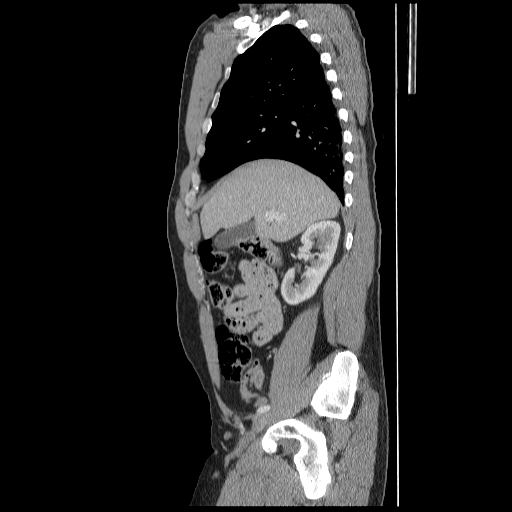
[im 43/129  bone]
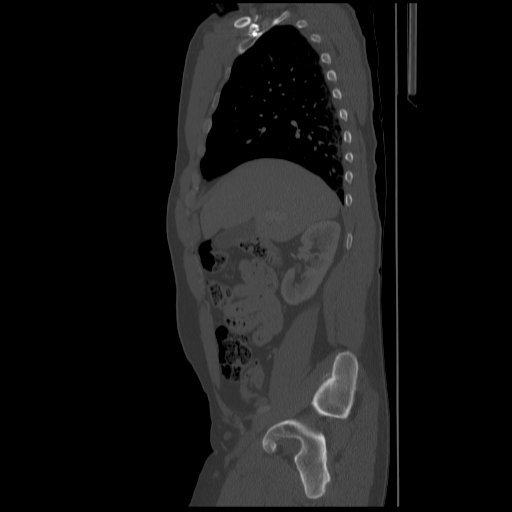

[13 of 46 positions shown; findings below may reference images not displayed]

FINDINGS: No evidence of mediastinal hematoma.

Patchy opacities in the bilateral lung bases, right greater than
left, likely reflecting atelectasis. No pleural effusion or
pneumothorax.

Visualized thyroid is unremarkable.

The heart is normal in size.  No pericardial effusion.

No suspicious mediastinal, hilar, or axillary lymphadenopathy.

Nondisplaced fracture of the right lateral 4th rib (series 3/image
24).  Nondisplaced fractures of the right posterior 8th-1th ribs
near the costovertebral junction (series 3/images 24, 31, and 36).
The thoracic spine is within normal limits.
IMPRESSION: Nondisplaced fracture of the right lateral 4th rib.  Nondisplaced
fractures of the right posterior 8th-1th ribs near the
costovertebral junction.

Otherwise, no evidence of traumatic injury to the chest.

CT ABDOMEN AND PELVIS
FINDINGS: 1.8 cm probable cyst versus hemangioma in the posterior
segment right hepatic lobe.

Spleen, pancreas, and adrenal glands are within normal limits.

Gallbladder is unremarkable.  No intrahepatic or extrahepatic
ductal dilatation.

Kidneys are within normal limits.  Very mild possible periureteral
stranding surrounding the left proximal collecting system (series
2/image 67), equivocal.  No hydronephrosis.

No evidence of bowel obstruction.  Normal appendix.

No evidence of abdominal aortic aneurysm.

No abdominopelvic ascites.

No hemoperitoneum.  No free air.

No suspicious abdominopelvic lymphadenopathy.

Prostate is unremarkable.

Bladder is within normal limits.

Tiny bilateral fat containing inguinal hernias.

Grade 1 spondylolisthesis of L5 on S1.  No acute fracture is seen.
IMPRESSION: No evidence of traumatic injury to the abdomen/pelvis.

## 2013-10-15 IMAGING — CR DG SHOULDER 2+V*R*
3 series · 3 of 3 positions shown · non-contrast
Comparison: None.

CLINICAL DATA: Status post fall.  Pain.

RIGHT SHOULDER - 2+ VIEW

[x shoulder ap right (1 of 3)]
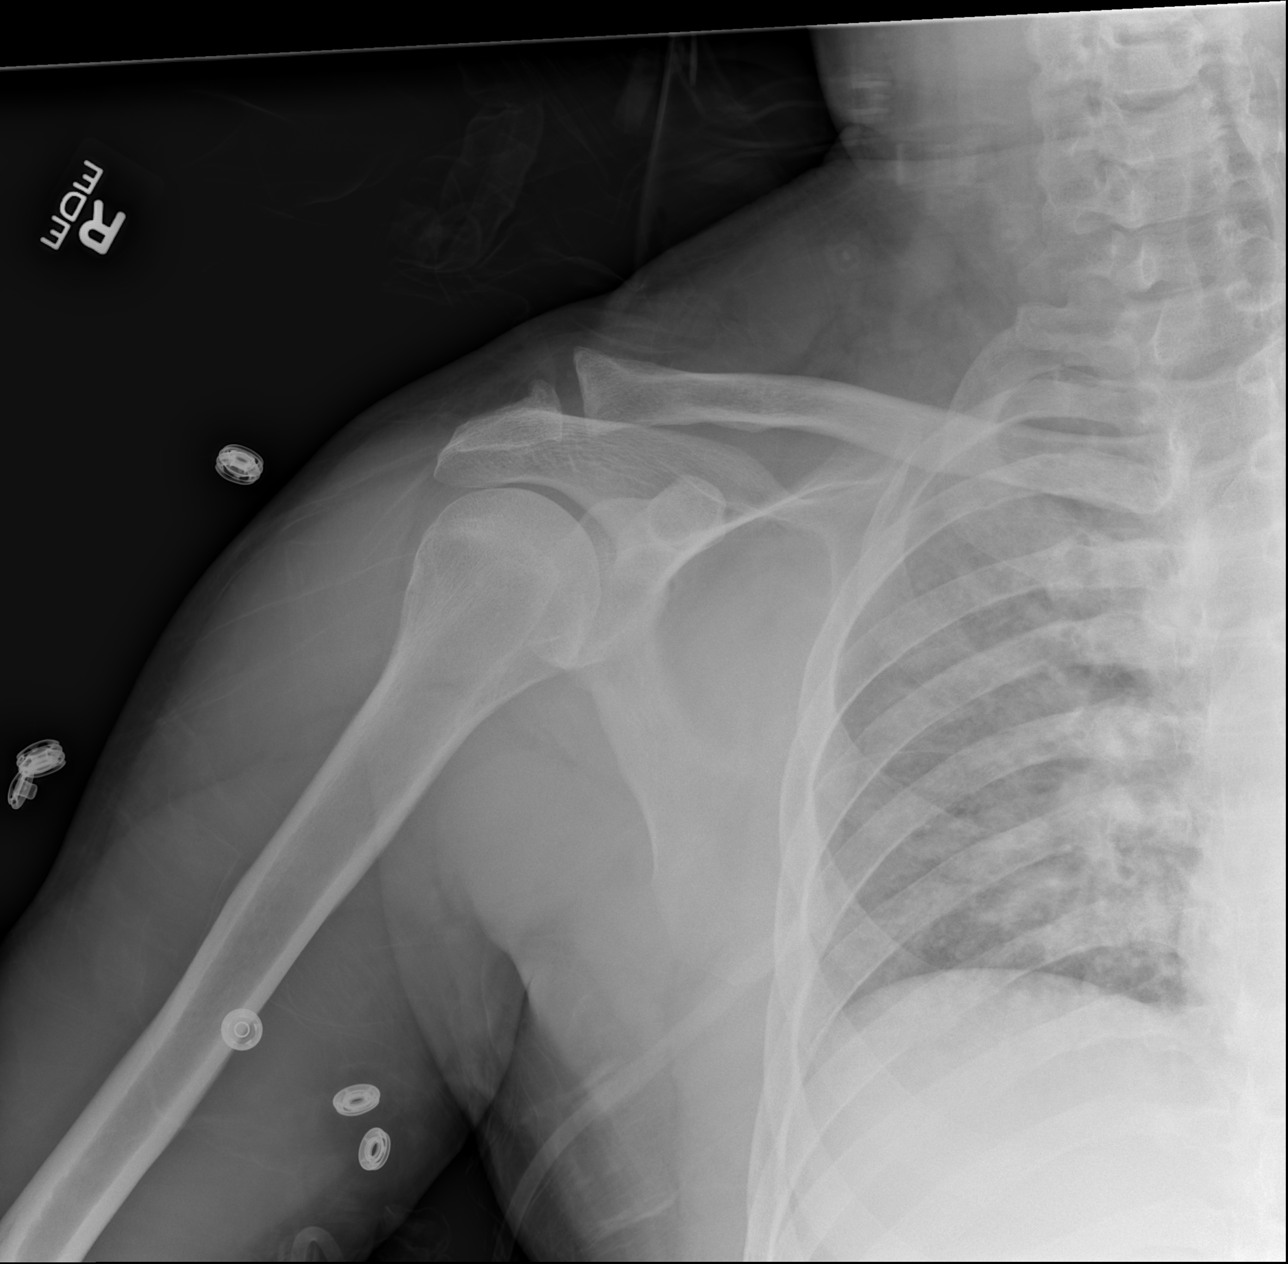

[x shoulder ap right (2 of 3)]
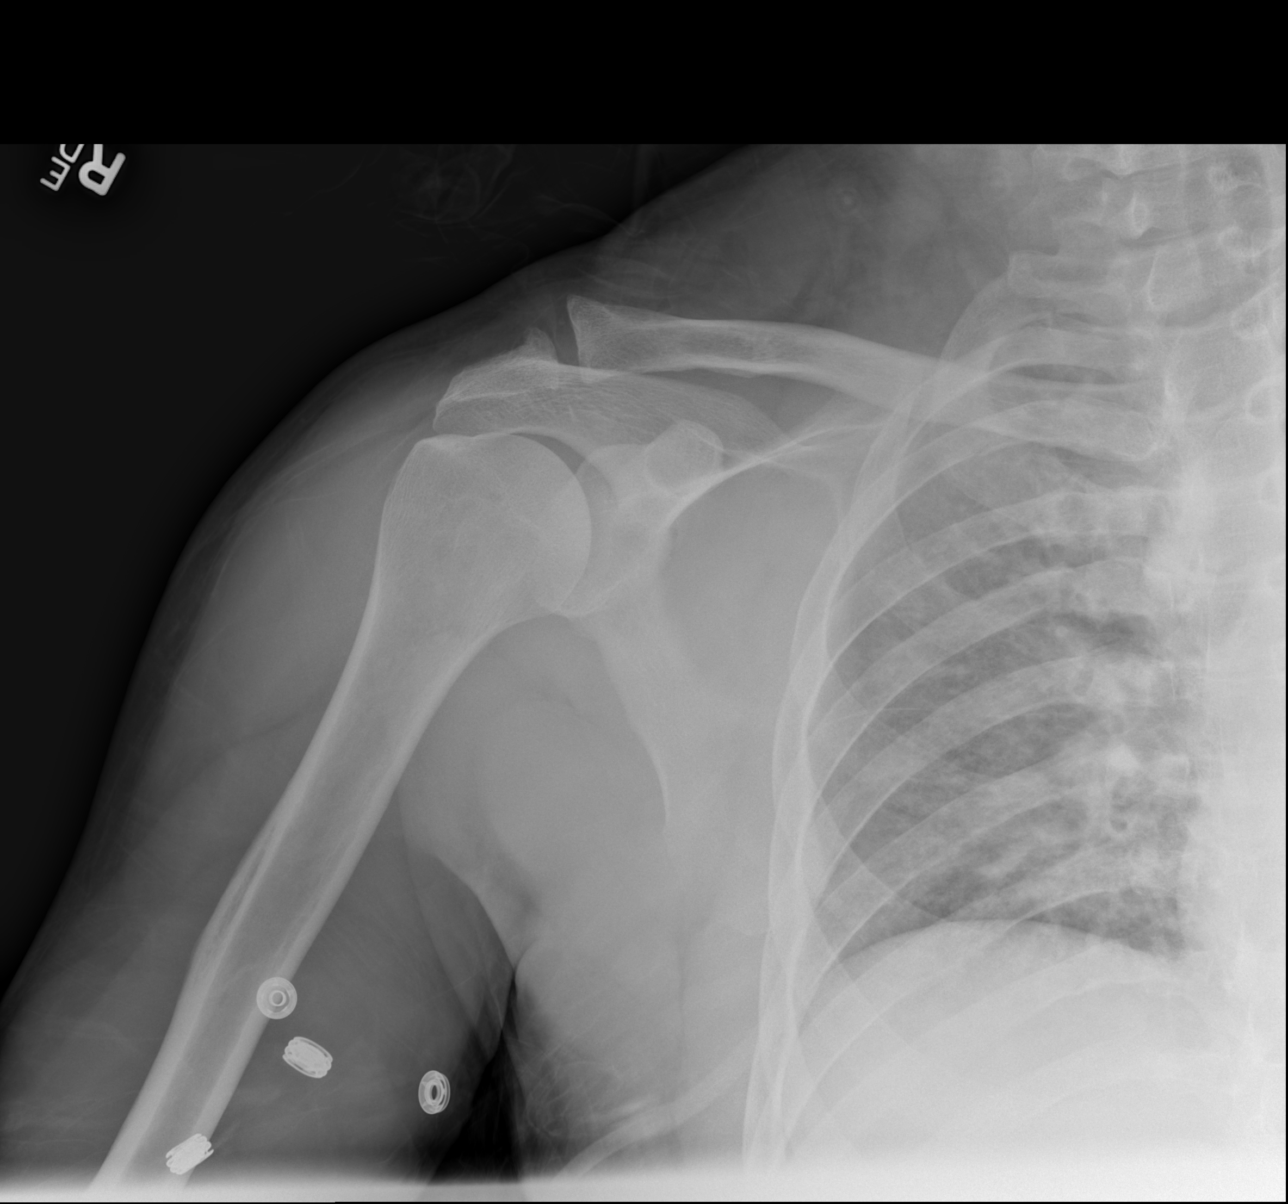

[x shoulder ap right (3 of 3)]
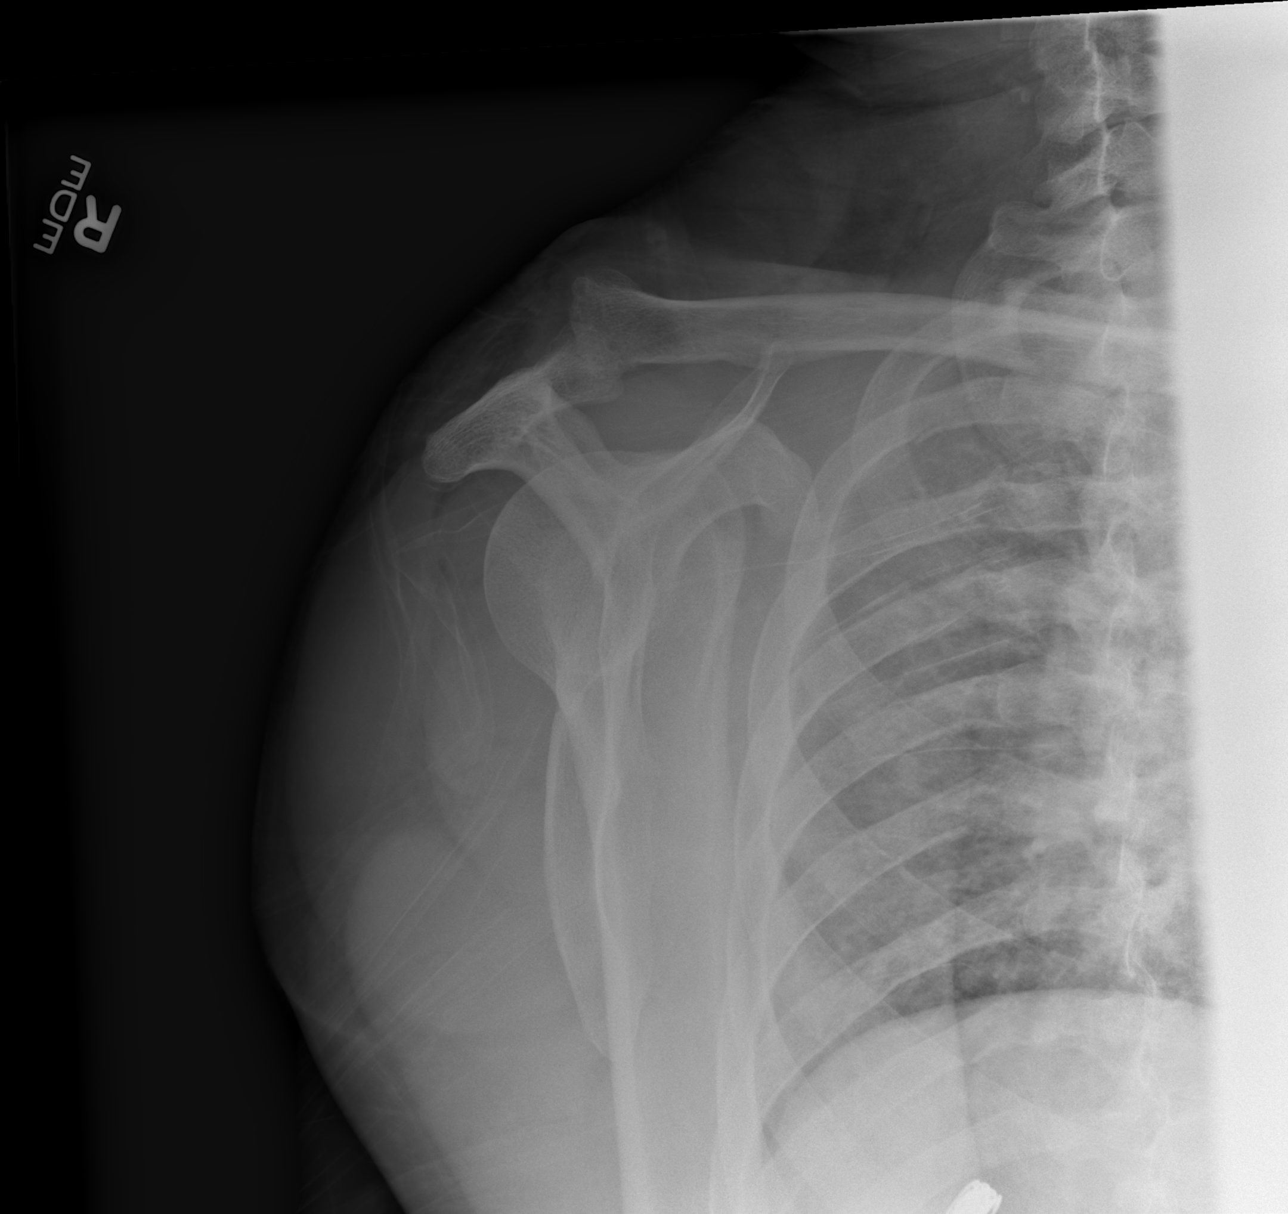

[3 of 3 positions shown; findings below may reference images not displayed]

FINDINGS: The humerus is located and the acromioclavicular joint is
intact.  Bulky acromioclavicular degenerative change is identified.
Imaged right lung and ribs appear normal.
IMPRESSION: No acute finding.  Acromioclavicular degenerative disease.

## 2013-10-17 IMAGING — CR DG CHEST 1V PORT
1 series · 1 of 1 positions shown · non-contrast
Comparison: 12/13/2011

CLINICAL DATA: Pain, right rib fractures

PORTABLE CHEST - 1 VIEW

[AP]
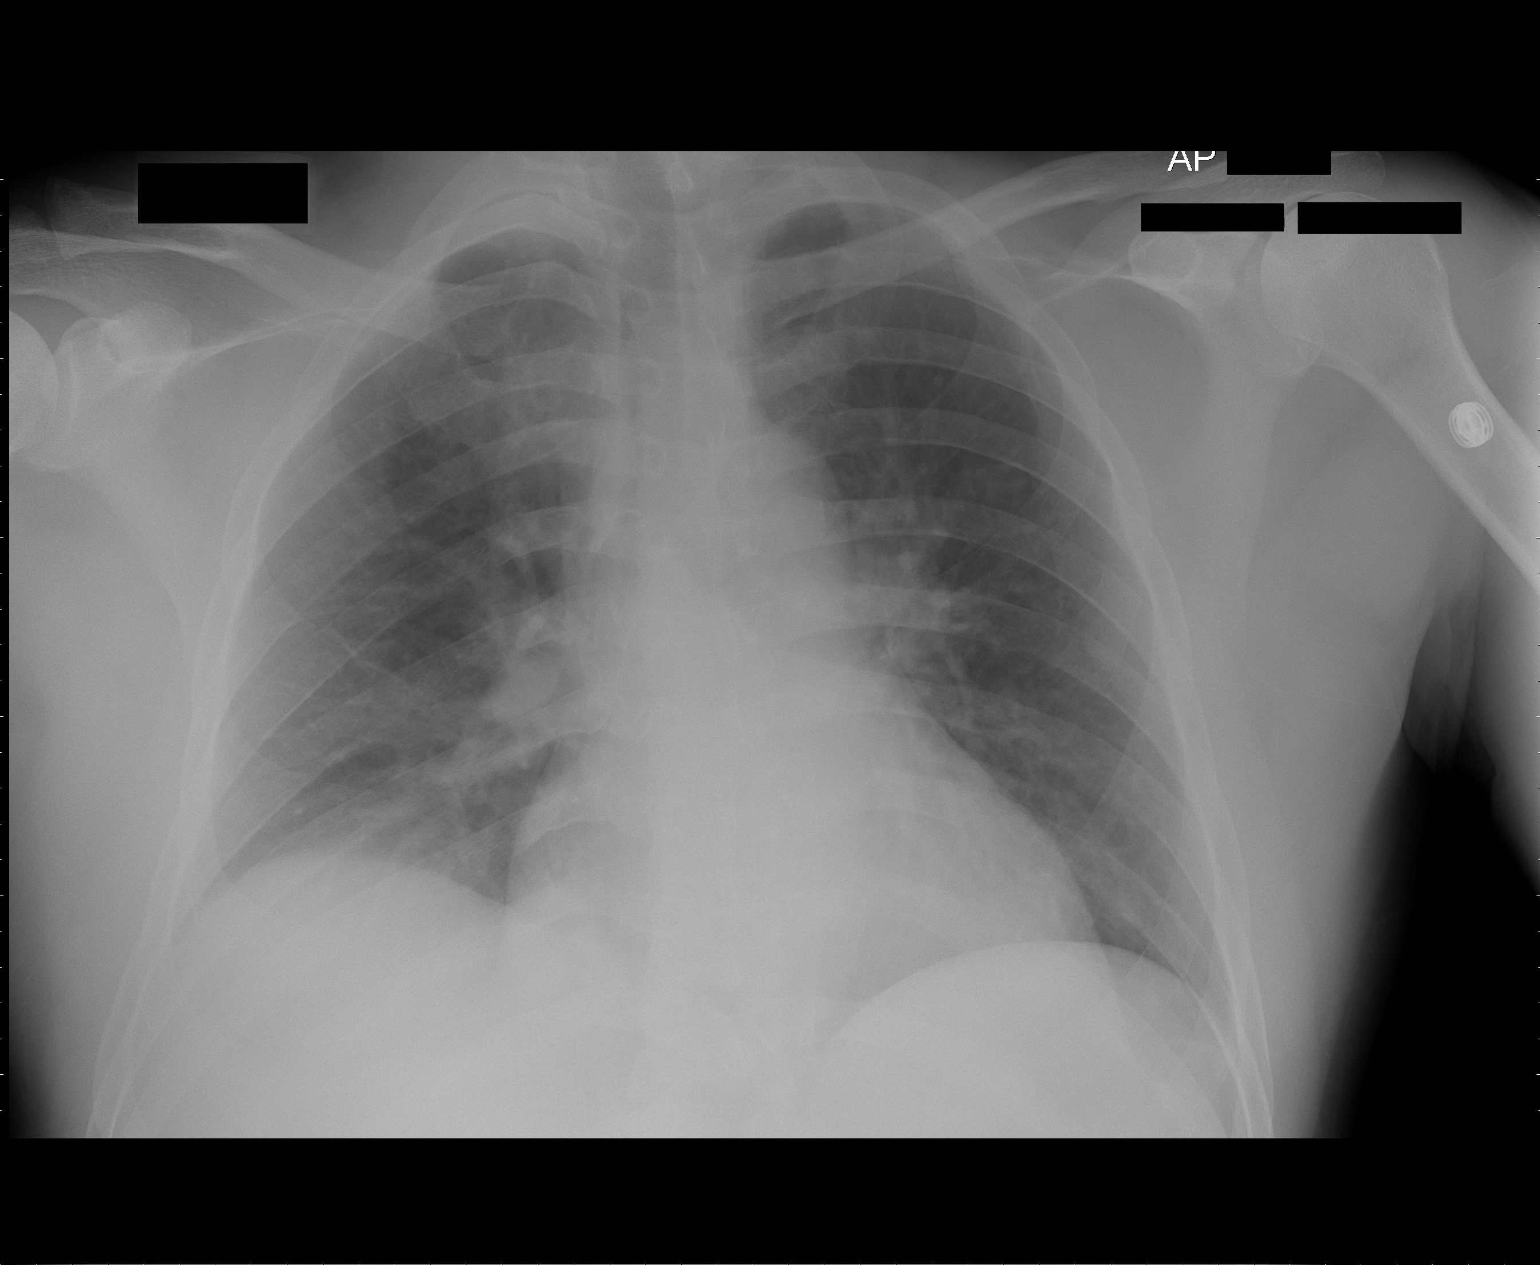

[1 of 1 positions shown; findings below may reference images not displayed]

FINDINGS: Slightly decreased lung volumes but negative for effusion
or pneumothorax.  Nondisplaced right rib fractures are visible by
CT but not plain radiography.  Stable heart size and vascularity.
Minimal right base atelectasis.  Trachea midline.
IMPRESSION: Minimal right base atelectasis.  Low lung volumes.

No pneumothorax.

## 2021-02-10 DIAGNOSIS — G40909 Epilepsy, unspecified, not intractable, without status epilepticus: Secondary | ICD-10-CM | POA: Insufficient documentation

## 2021-02-10 DIAGNOSIS — R569 Unspecified convulsions: Secondary | ICD-10-CM | POA: Insufficient documentation

## 2021-05-02 ENCOUNTER — Ambulatory Visit (INDEPENDENT_AMBULATORY_CARE_PROVIDER_SITE_OTHER): Payer: 59 | Admitting: Neurology

## 2021-05-02 ENCOUNTER — Telehealth: Payer: Self-pay | Admitting: Neurology

## 2021-05-02 ENCOUNTER — Other Ambulatory Visit: Payer: Self-pay

## 2021-05-02 ENCOUNTER — Encounter: Payer: Self-pay | Admitting: Neurology

## 2021-05-02 VITALS — BP 121/80 | HR 61 | Resp 18 | Ht 68.0 in | Wt 172.0 lb

## 2021-05-02 DIAGNOSIS — Z87820 Personal history of traumatic brain injury: Secondary | ICD-10-CM | POA: Diagnosis not present

## 2021-05-02 DIAGNOSIS — R569 Unspecified convulsions: Secondary | ICD-10-CM | POA: Diagnosis not present

## 2021-05-02 NOTE — Telephone Encounter (Signed)
Robaxin taken out.

## 2021-05-02 NOTE — Telephone Encounter (Signed)
Pt was seen this am, he wanted to update his medicine chart. Robaxin 500mg  needs to be taken off.
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
## 2021-05-02 NOTE — Patient Instructions (Addendum)
Schedule 24-hour EEG  2. Follow-up in 4-5 months, call for any changes   Seizure Precautions: 1. If medication has been prescribed for you to prevent seizures, take it exactly as directed.  Do not stop taking the medicine without talking to your doctor first, even if you have not had a seizure in a long time.   2. Avoid activities in which a seizure would cause danger to yourself or to others.  Don't operate dangerous machinery, swim alone, or climb in high or dangerous places, such as on ladders, roofs, or girders.  Do not drive unless your doctor says you may.  3. If you have any warning that you may have a seizure, lay down in a safe place where you can't hurt yourself.    4.  No driving for 6 months from last seizure, as per [facility].   Please refer to the following link on the [employer]ile[employer] of [employer]'s website for more information: http://www.epilepsyfoundation.org/answerplace/Social/driving/drivingu.cfm   5.  Maintain good sleep hygiene. Avoid alcohol.  6.  Contact your doctor if you have any problems that may be related to the medicine you are taking.  7.  Call 911 and bring the patient back to the ED if:        A.  The seizure lasts longer than 5 minutes.       B.  The patient doesn't awaken shortly after the seizure  C.  The patient has new problems such as difficulty seeing, speaking or moving  D.  The patient was injured during the seizure  E.  The patient has a temperature over 102 F (39C)  F.  The patient vomited and now is having trouble breathing

## 2021-05-05 ENCOUNTER — Ambulatory Visit (INDEPENDENT_AMBULATORY_CARE_PROVIDER_SITE_OTHER): Payer: 59 | Admitting: Neurology

## 2021-05-05 ENCOUNTER — Other Ambulatory Visit: Payer: Self-pay

## 2021-05-05 DIAGNOSIS — R569 Unspecified convulsions: Secondary | ICD-10-CM

## 2021-05-05 DIAGNOSIS — Z87820 Personal history of traumatic brain injury: Secondary | ICD-10-CM | POA: Diagnosis not present

## 2021-06-01 NOTE — Procedures (Signed)
ELECTROENCEPHALOGRAM REPORT  Dates of Recording: 05/05/2021 8:01AM to 05/06/2021  8:07AM  Patient's Name: [name] MRN: [id number] Date of Birth: January 04, 1966  Referring Provider: Dr. Patrcia Dolly  Procedure: 24-hour ambulatory video EEG  History: This is a 55 year old man with TBI, seizure in 01/2021 suggestive of right hemispheric onset in the setting of alcohol use, poor sleep. EEG for classification.  Medications:  Vitamin C Vitamin B12 multivitamin  Technical Summary: This is a 24-hour multichannel digital video EEG recording measured by the international 10-20 system with electrodes applied with paste and impedances below 5000 ohms performed as portable with EKG monitoring.  The digital EEG was referentially recorded, reformatted, and digitally filtered in a variety of bipolar and referential montages for optimal display.    DESCRIPTION OF RECORDING: During maximal wakefulness, the background activity consisted of a symmetric 9 Hz posterior dominant rhythm which was reactive to eye opening.  There were no epileptiform discharges or focal slowing seen in wakefulness.  During the recording, the patient progresses through wakefulness, drowsiness, and Stage 2 sleep.  Again, there were no epileptiform discharges seen.  Events: There were no push button events.  There were no electrographic seizures seen.  EKG lead was unremarkable.  IMPRESSION: This 24-hour ambulatory video EEG study is normal.    CLINICAL CORRELATION: A normal EEG does not exclude a clinical diagnosis of epilepsy.  If further clinical questions remain, inpatient video EEG monitoring may be helpful.   Patrcia Dolly, M.D.

## 2021-06-03 ENCOUNTER — Telehealth: Payer: Self-pay | Admitting: Neurology

## 2021-06-03 NOTE — Progress Notes (Signed)
Patient advised of EEG results. Voiced understanding

## 2021-06-03 NOTE — Telephone Encounter (Signed)
Pt said he is returning a call to someone. He doesn't know who called him.

## 2021-08-24 ENCOUNTER — Telehealth: Payer: Self-pay | Admitting: Neurology

## 2021-08-24 NOTE — Telephone Encounter (Signed)
Pt has been having seizures, and wanted to be soon.has an appt in dec, canx cause he was doing fine. Took keppra for a few weeks, then weaned himself off. [place] Spaniel it made him funny while taking keppra.

## 2021-08-24 NOTE — Telephone Encounter (Signed)
Pt had a seizure last night he has weaned himself off the keppra for the past 2 weeks he said he was dizzy on the medication and also very hyper non stop talking . Pt stated that the seizures he has had have happened 20 mins after waking up.  Asking about another seizure medication   Pt c/o: seizure Missed medications?  Yes.   Sleep deprived?  No. 7 hours of sleep takes melatonin (2) gummy to help  Alcohol intake?  No. Back to their usual baseline self?  Yes.  . If no, advise go to ER Current medications prescribed by Dr. Karel Jarvis: Keppra pt Stopped taken it   Pt knows that Dr Karel Jarvis is out of the office for 2 weeks but knows that he needs a sooner appointment that then may of 2023

## 2021-09-01 NOTE — Telephone Encounter (Signed)
I have an opening on 12/12 at 11am, can he come in then? Thanks

## 2021-09-02 NOTE — Telephone Encounter (Signed)
Pt is sch for 09-05-21

## 2021-09-05 ENCOUNTER — Encounter: Payer: Self-pay | Admitting: Neurology

## 2021-09-05 ENCOUNTER — Other Ambulatory Visit: Payer: Self-pay

## 2021-09-05 ENCOUNTER — Ambulatory Visit (INDEPENDENT_AMBULATORY_CARE_PROVIDER_SITE_OTHER): Payer: 59 | Admitting: Neurology

## 2021-09-05 VITALS — BP 119/74 | HR 68 | Ht 68.0 in | Wt 171.6 lb

## 2021-09-05 DIAGNOSIS — G40209 Localization-related (focal) (partial) symptomatic epilepsy and epileptic syndromes with complex partial seizures, not intractable, without status epilepticus: Secondary | ICD-10-CM | POA: Diagnosis not present

## 2021-09-05 MED ORDER — CITALOPRAM HYDROBROMIDE 10 MG PO TABS: ORAL_TABLET | ORAL | 11 refills | Status: DC

## 2021-09-05 MED ORDER — LEVETIRACETAM 100 MG/ML PO SOLN: ORAL | 11 refills | Status: DC

## 2021-09-05 NOTE — Patient Instructions (Addendum)
Start taking the Levetiracetam 100mg /mL: take 10 mL every night  2. Start citalopram 10mg  every night  3. As per [place] driving laws, no driving until 6 months seizure-free  4. Follow-up as scheduled in May, call for any changes   Seizure Precautions: 1. If medication has been prescribed for you to prevent seizures, take it exactly as directed.  Do not stop taking the medicine without talking to your doctor first, even if you have not had a seizure in a long time.   2. Avoid activities in which a seizure would cause danger to yourself or to others.  Don't operate dangerous machinery, swim alone, or climb in high or dangerous places, such as on ladders, roofs, or girders.  Do not drive unless your doctor says you may.  3. If you have any warning that you may have a seizure, lay down in a safe place where you can't hurt yourself.    4.  No driving for 6 months from last seizure, as per [facility].   Please refer to the following link on the [employer]ile[employer] of [employer]'s website for more information: http://www.epilepsyfoundation.org/answerplace/Social/driving/drivingu.cfm   5.  Maintain good sleep hygiene. Avoid alcohol.  6.  Contact your doctor if you have any problems that may be related to the medicine you are taking.  7.  Call 911 and bring the patient back to the ED if:        A.  The seizure lasts longer than 5 minutes.       B.  The patient doesn't awaken shortly after the seizure  C.  The patient has new problems such as difficulty seeing, speaking or moving  D.  The patient was injured during the seizure  E.  The patient has a temperature over 102 F (39C)  F.  The patient vomited and now is having trouble breathing
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
## 2021-09-08 ENCOUNTER — Ambulatory Visit: Payer: No Typology Code available for payment source | Admitting: Neurology

## 2021-09-27 ENCOUNTER — Other Ambulatory Visit: Payer: Self-pay | Admitting: Neurology

## 2021-11-10 ENCOUNTER — Telehealth: Payer: Self-pay | Admitting: Neurology

## 2021-11-10 NOTE — Telephone Encounter (Signed)
Patient's girlfriend [name] called on behalf of the patient.  She said he'd like to know if there is another seizure medication that does not cause the side effect of hair loss.  He has notice some thinning on top of his head she explained.  Please return patient's call to patient at: [phone]

## 2021-11-11 NOTE — Telephone Encounter (Signed)
Pls ask if he has had any seizures. Also if he started the anxiety medication citalopram. Between Keppra and citalopram, hair thinning is more frequent with citalopram. Taking centrum silver may help, or we can stop citalopram. He is on a very low dose of Keppra right now, and if no seizures, I am hesitant to change it. Thanks

## 2021-11-11 NOTE — Telephone Encounter (Signed)
Pt called and asked has he had any seizures? No he has not had any. Also if he started the anxiety medication citalopram? He has not stared the citalopram.  Between Keppra and citalopram, hair thinning is more frequent with citalopram. Pt advised to start taken the centrum silver may help. He is on a very low dose of Keppra right now, and if no seizures, I am hesitant to change it.

## 2022-02-15 ENCOUNTER — Ambulatory Visit: Payer: Self-pay | Admitting: Neurology

## 2022-02-21 ENCOUNTER — Telehealth: Payer: Self-pay | Admitting: Neurology

## 2022-02-21 MED ORDER — LEVETIRACETAM 100 MG/ML PO SOLN: ORAL | 5 refills | Status: DC

## 2022-02-21 NOTE — Telephone Encounter (Signed)
Pls have them increase the Levetiracetam 100mg /mL: take 41mL in AM, 76mL in PM. Valium is only a rescue medication if he has a seizure, there is a nasal spray that she can administer to him as needed. It should not be taken every night for sleep, it can cause dependence if taken regularly. Pls send in updated Rx for Keppra and let me know if he would like the Valtoco nasal spray for seizure rescue. Thanks

## 2022-02-21 NOTE — Telephone Encounter (Signed)
Pt called spoke with girlfriend [name] who is on the DPR increase the Levetiracetam 100mg /mL: take 28mL in AM, 85mL in PM. Valium is only a rescue medication if he has a seizure, there is a nasal spray that she can administer to him as needed. It should not be taken every night for sleep, it can cause dependence if taken regularly. They would like to have the [facility] [address] [place].  [phone]

## 2022-02-21 NOTE — Telephone Encounter (Signed)
Patients girlfriend Charleston Ropes called and stated Michale had a couple seizures statrting at 3 am this morning.  She was calling to see if Dr wanted to up the dosage on his [place].  If so can they get [facility] [address].  [phone]

## 2022-02-21 NOTE — Telephone Encounter (Signed)
Pt c/o: seizure Missed medications?  No. Sleep deprived?  No. Some nights more that others he dreams a lot  Alcohol intake?  No. Increased stress? Yes.   Any change in medication color or shape? No. Any triggers? No. He was sleeping,  Back to their usual baseline self?  Yes.  . If no, advise go to ER Current medications prescribed by Dr. Karel Jarvis:  Levetiracetam 100mg /ml  Take 82mL every night ( he takes 9m in the morning and at night) Pt has never started the Citalopram, they are asking for a Valium 1/4 of a tablet to help him rest at night and sleep.  He went to the hospital they gave him liquid Valium to bring him out of the seizures,

## 2022-02-22 MED ORDER — VALTOCO 20 MG DOSE 10 MG/0.1ML NA LQPK: NASAL | 5 refills | Status: DC

## 2022-02-22 NOTE — Addendum Note (Signed)
Addended by: Cameron Sprang on: 02/22/2022 09:00 AM   Modules accepted: Orders

## 2022-02-23 ENCOUNTER — Telehealth: Payer: Self-pay | Admitting: Neurology

## 2022-02-23 NOTE — Telephone Encounter (Signed)
Called rx to [facility]. Nasal spray for seizures.   [place] [id number] MEMBER NUMBER [id number] RX GROUP [id number] [phone]   [place] Spaniel it needs to have PA.

## 2022-02-24 ENCOUNTER — Telehealth (HOSPITAL_COMMUNITY): Payer: Self-pay | Admitting: Pharmacy Technician

## 2022-02-24 ENCOUNTER — Other Ambulatory Visit (HOSPITAL_COMMUNITY): Payer: Self-pay

## 2022-02-24 NOTE — Telephone Encounter (Signed)
Patient Advocate Encounter  Prior Authorization for Valtoco 20 MG Dose 10MG /0.1ML liquid has been approved.    PA# Effective dates: 02/24/2022 through 08/26/2022      14/10/2021, CPhT Pharmacy Patient Advocate Specialist [facility] Patient Advocate Team Direct Number: [phone]  Fax: [phone]

## 2022-02-24 NOTE — Telephone Encounter (Signed)
Patient Advocate Encounter   Received notification that prior authorization for Valtoco 20 MG Dose 10MG /0.1ML liquid is required.   PA submitted on 02/24/2022 Key [id number] Status is pending       04/26/2022, CPhT Pharmacy Patient Advocate Specialist [facility] Patient Advocate Team Direct Number: [phone]  Fax: [phone]

## 2022-03-06 MED ORDER — VALTOCO 20 MG DOSE 10 MG/0.1ML NA LQPK: NASAL | 5 refills | Status: DC

## 2022-03-06 NOTE — Telephone Encounter (Signed)
Pt called in stating he is back home in [place] and [facility] cannot be [facility] from [facility] due to it being a controlled substance. They need a new prescription for the nasal [facility] [address].

## 2022-03-06 NOTE — Telephone Encounter (Signed)
Rx sent, thanks 

## 2022-03-08 ENCOUNTER — Telehealth: Payer: Self-pay | Admitting: Neurology

## 2022-03-08 MED ORDER — LEVETIRACETAM ER 750 MG PO TB24: ORAL_TABLET | ORAL | 5 refills | Status: DC

## 2022-03-08 NOTE — Telephone Encounter (Signed)
Called patient and left voicemail to called the office back

## 2022-03-08 NOTE — Telephone Encounter (Signed)
Pls let him know I sent in the extended-release Keppra, and we will reduce it slightly so he hopefully tolerates it better. It is Keppra ER 750mg : Take 2 tablets every night. If it still makes him hyper and affects sleep, he can take the 2 tablets every morning (instead of every night). [facility] [address] [place]

## 2022-03-08 NOTE — Telephone Encounter (Signed)
Patient called and stated his dosage for keppra was doubled at last visit, he states that he does not like how it makes him feel.  He feels wired and more hyper.  He said the Dr had mentioned a pill form that is time release?

## 2022-03-09 NOTE — Telephone Encounter (Signed)
Called patient and let him know Dr.  Karel Jarvis recommendations and he is very pleasedrecommendation

## 2022-03-10 ENCOUNTER — Telehealth: Payer: Self-pay | Admitting: Neurology

## 2022-03-10 MED ORDER — VALTOCO 20 MG DOSE 10 MG/0.1ML NA LQPK: NASAL | 5 refills | Status: DC

## 2022-03-10 NOTE — Telephone Encounter (Signed)
Pt called an informed prescription was sent to [facility] on [address] in [place] [place]

## 2022-03-10 NOTE — Telephone Encounter (Signed)
Left message to have diazpam transferred to [facility] on [address]  [facility]

## 2022-03-10 NOTE — Telephone Encounter (Signed)
I sent it to [facility] on [address] in [place], hopefully that is the correct location. Thanks

## 2022-03-14 ENCOUNTER — Telehealth: Payer: Self-pay | Admitting: Neurology

## 2022-03-14 NOTE — Telephone Encounter (Signed)
error 

## 2022-03-16 ENCOUNTER — Telehealth: Payer: Self-pay | Admitting: Neurology

## 2022-03-16 NOTE — Telephone Encounter (Signed)
Patient called and stated he has a question about medication?  Its from when he went to the ED.  Please call back.

## 2022-03-22 NOTE — Telephone Encounter (Signed)
Pls have him take the 2 tabs of Keppra in the morning and not at night so it does not affect sleep. We do not prescribe Valium for sleep, he has to talk to his PCP about sleep or mood medication if the citalopram did not help with mood. Thanks

## 2022-05-18 ENCOUNTER — Telehealth: Payer: Self-pay | Admitting: Neurology

## 2022-05-18 ENCOUNTER — Other Ambulatory Visit: Payer: Self-pay

## 2022-05-18 NOTE — Telephone Encounter (Signed)
1. Which medications need refilled? (List name and dosage, if known) citolopram 10 MG  2. Which pharmacy/location is medication to be sent to? (include street and city if local pharmacy) [facility] on [address] in [place]  3. Do they need a 30 day or 90 day supply? 90 is preferred, if possible.  Trying to transfer due to cost but not having success.

## 2022-05-18 NOTE — Telephone Encounter (Signed)
Sent to Dr.Aquino for approval

## 2022-05-18 NOTE — Telephone Encounter (Signed)
Patient returned call

## 2022-05-19 MED ORDER — CITALOPRAM HYDROBROMIDE 10 MG PO TABS: ORAL_TABLET | ORAL | 1 refills | Status: DC

## 2022-08-21 ENCOUNTER — Telehealth: Payer: Self-pay | Admitting: Neurology

## 2022-08-21 NOTE — Telephone Encounter (Signed)
Pt called in stating he had a seizure last Monday 08/14/22. He has been having sleeping issues and has been irritable and stressed. He has some Lorazepam 1mg  tablets and they have been working and he has been sleeping well. His family also says his demeanor is much better since taking the Lorazepam. He would like to see if she can prescribe this for him so he can sleep better? He went off of the citalopram 2-3 weeks ago. It wasn't working at all.

## 2022-08-21 NOTE — Telephone Encounter (Signed)
We can only prescribe 10 tablets of lorazepam every month for seizure rescue. We do not use it to help with sleep. He will have to speak to his PCP about this if he needs medication to help with sleep. Since he is still having seizures, will need to see him earlier that June 2024 because his last visit was almost a year ago. Will put him on waitlist, thanks

## 2022-08-21 NOTE — Telephone Encounter (Signed)
Pt c/o: seizure Missed medications?  No. Sleep deprived?  Yes.   4 to 5 hours of sleep at night  Alcohol intake?  No. Increased stress? Yes.   Work related  Any change in medication color or shape? No. Any triggers? Didn't feel good.  Back to their usual baseline self?  Yes.  . If no, advise go to ER Current medications prescribed by Dr. Karel Jarvis: levetiracetam 750 mg  Take 1 tablets every day  at 7 am  Levetiracetam liquid 4ml  at night at 7pm,  Not taken the Citalopram he couldn't sleep good. He was in a really bad mood, had an increase in stress,   Hospital gave him Lorazepam and it has helped him he said he slept 10 hours last night

## 2022-08-22 NOTE — Telephone Encounter (Signed)
Pt called an informed that We can only prescribe 10 tablets of lorazepam every month for seizure rescue. We do not use it to help with sleep. He will have to speak to his PCP about this if he needs medication to help with sleep. Since he is still having seizures, will need to see him earlier that June 2024 because his last visit was almost a year ago. Will put him on waitlis. He will call his PCP to see if they can help him with getting the Lorazepam for sleep. Pt is asking for 10 tablets of the Lorazepam while he is waiting to be seen by PCP ,

## 2022-08-23 NOTE — Telephone Encounter (Signed)
Pls let patient know that there is a Building control surveyor for controlled substances, we cannot prescribe the Lorazepam because he just got it 8 days ago. He also filled the Valtoco nasal spray, which is indicated for seizure rescue. We will not be prescribing lorazepam, the Valtoco nasal spray to use for seizure rescue.

## 2022-08-23 NOTE — Telephone Encounter (Signed)
Called pateint and he understood and is reaching out to PCP

## 2022-09-01 ENCOUNTER — Ambulatory Visit: Payer: Self-pay | Admitting: Neurology

## 2022-09-11 ENCOUNTER — Telehealth: Payer: Self-pay

## 2022-09-11 NOTE — Telephone Encounter (Signed)
Patient Advocate Encounter  Prior Authorization for Valtoco 20 MG Dose 10MG /0.1ML liquid has been approved through .  Effective: 09-07-2022 to 03-06-2023

## 2022-10-11 ENCOUNTER — Telehealth: Payer: Self-pay | Admitting: Neurology

## 2022-10-11 NOTE — Telephone Encounter (Signed)
Pt c/o: seizure has on 10/08/22 pt girl friend stated that she thought he had stopped breathing a few times before he had the seizure he has a lot of dreams a at night. He has more seizures he has not called in Dates listed below: 11/20 @12pm  5/29 @3am  7/1 @ 2:10 pm stress  10/08/22 6:50 am He has had 7 total  not sure off all dates and times we may have some of the others charted from 1st time he has seen [place]  Missed medications?  No. Sleep deprived?  Yes.   Some times he gets up at 2 or 3 am, on a regular bases he get 6 hours of sleep  Alcohol intake?  No. Increased stress? No. Any change in medication color or shape? No. Back to their usual baseline self?  Yes.  . If no, advise go to ER Current medications prescribed by Dr. Delice Lesch: levetiracetam 750 mg take 1 takes one tab in the day and he increased the 84ml at night his self the last nights to 15 ml at night   Citaliopram 10mg  at night Has the valtoco but left it at home didn't take it with him to the beach   He is taken protien powder with amino acid going to change to it with out the amino acid, Is drinking coffee 50/50 is going back to decaf    Asking about valium said that it help with controlling his seizures and helps with his mood  Also asking about just taking liquid levetiracetam because instead of the pills because it does not wire him up as bad and he feel like it may help his seizures better.   He is upset because he thinks he is going to loose he girl friend and getting frustrated, he is asking for help ,

## 2022-10-11 NOTE — Telephone Encounter (Signed)
Pt called in stating he had another seizure on Sunday. He has been taking his medication and increased his keppra. He says all of his seizures are either in his sleep or first thing in the morning.

## 2022-10-12 NOTE — Telephone Encounter (Signed)
Pt called informed that He has not been seen since Dec 2022. He needs to come to the office for a follow-up for his seizures. We will put him on the cancellation list.

## 2022-10-12 NOTE — Telephone Encounter (Signed)
He has not been seen since Dec 2022. He needs to come to the office for a follow-up for his seizures. We will put him on the cancellation list.

## 2022-10-17 ENCOUNTER — Telehealth: Payer: Self-pay

## 2022-10-17 ENCOUNTER — Ambulatory Visit: Payer: No Typology Code available for payment source | Admitting: Neurology

## 2022-10-17 NOTE — Telephone Encounter (Signed)
R/s to 10/18/22

## 2022-10-17 NOTE — Telephone Encounter (Signed)
Patient showed up late to appointment today, advised we would have to reschedule due to time. [name] is asking if Dr.Aquino would be able to work him in somewhere else sooner than June.

## 2022-10-18 ENCOUNTER — Encounter: Payer: Self-pay | Admitting: Neurology

## 2022-10-18 ENCOUNTER — Ambulatory Visit (INDEPENDENT_AMBULATORY_CARE_PROVIDER_SITE_OTHER): Payer: 59 | Admitting: Neurology

## 2022-10-18 VITALS — BP 136/79 | HR 73 | Ht 68.0 in | Wt 178.0 lb

## 2022-10-18 DIAGNOSIS — R0681 Apnea, not elsewhere classified: Secondary | ICD-10-CM

## 2022-10-18 DIAGNOSIS — G40209 Localization-related (focal) (partial) symptomatic epilepsy and epileptic syndromes with complex partial seizures, not intractable, without status epilepticus: Secondary | ICD-10-CM

## 2022-10-18 MED ORDER — LEVETIRACETAM 100 MG/ML PO SOLN: 1500.0000 mg | Freq: Every day | ORAL | 11 refills | Status: DC

## 2022-10-18 MED ORDER — LEVETIRACETAM ER 750 MG PO TB24: ORAL_TABLET | ORAL | 3 refills | Status: DC

## 2022-10-18 MED ORDER — VALTOCO 20 MG DOSE 10 MG/0.1ML NA LQPK: NASAL | 5 refills | Status: DC

## 2022-10-18 NOTE — Patient Instructions (Signed)
Good to see you.  Schedule sleep study  2. Continue Keppra XR 750mg : take 1 tablet every morning  3. Continue liquid Keppra 100mg /65mL: Take 13mL every night  4. Refills sent for Valtoco nasal spray  5. Continue seizure calendar, follow-up in 3-4 months, call for any changes   Seizure Precautions: 1. If medication has been prescribed for you to prevent seizures, take it exactly as directed.  Do not stop taking the medicine without talking to your doctor first, even if you have not had a seizure in a long time.   2. Avoid activities in which a seizure would cause danger to yourself or to others.  Don't operate dangerous machinery, swim alone, or climb in high or dangerous places, such as on ladders, roofs, or girders.  Do not drive unless your doctor says you may.  3. If you have any warning that you may have a seizure, lay down in a safe place where you can't hurt yourself.    4.  No driving for 6 months from last seizure, as per [facility]   Please refer to the following link on the [place] website for more information: http://www.epilepsyfoundation.org/answerplace/Social/driving/drivingu.cfm   5.  Maintain good sleep hygiene. Avoid alcohol  6.  Contact your doctor if you have any problems that may be related to the medicine you are taking.  7.  Call 911 and bring the patient back to the ED if:        A.  The seizure lasts longer than 5 minutes.       B.  The patient doesn't awaken shortly after the seizure  C.  The patient has new problems such as difficulty seeing, speaking or moving  D.  The patient was injured during the seizure  E.  The patient has a temperature over 102 F (39C)  F.  The patient vomited and now is having trouble breathing
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
## 2022-10-27 ENCOUNTER — Other Ambulatory Visit: Payer: Self-pay

## 2022-10-27 ENCOUNTER — Telehealth: Payer: Self-pay | Admitting: Neurology

## 2022-10-27 DIAGNOSIS — G40209 Localization-related (focal) (partial) symptomatic epilepsy and epileptic syndromes with complex partial seizures, not intractable, without status epilepticus: Secondary | ICD-10-CM

## 2022-10-27 DIAGNOSIS — R0681 Apnea, not elsewhere classified: Secondary | ICD-10-CM

## 2022-10-27 NOTE — Telephone Encounter (Signed)
Pt called in stating he was supposed to be referred to [facility] and had not heard from anyone to schedule. He is wanting their phone number to contact them.

## 2022-10-27 NOTE — Telephone Encounter (Signed)
It called informed that his referral was placed we or they will have to get a PA from the insurance an then they will call him for him to give it another week and if he doesn't hear anything call back.,

## 2022-11-01 ENCOUNTER — Telehealth: Payer: Self-pay | Admitting: Anesthesiology

## 2022-11-01 NOTE — Telephone Encounter (Signed)
[name] from [place] at [facility] left message stating insurance has denied request for sleep study for pt. If any questions call back number is [phone].

## 2022-11-03 ENCOUNTER — Telehealth: Payer: Self-pay

## 2022-11-03 NOTE — Telephone Encounter (Signed)
Insurance company called seizure montage was approved 2/9/ until 8/7 approval number [place] [id number]

## 2022-11-03 NOTE — Telephone Encounter (Signed)
Approved.  

## 2022-11-03 NOTE — Telephone Encounter (Signed)
Insurance company called seizure montage was approved 2/9/ until 8/7 approval number is [id number]    Sleep center called they are going to get him scheduled

## 2022-11-19 ENCOUNTER — Ambulatory Visit (HOSPITAL_BASED_OUTPATIENT_CLINIC_OR_DEPARTMENT_OTHER): Payer: 59 | Attending: Neurology | Admitting: Internal Medicine

## 2022-11-19 VITALS — Ht 68.0 in | Wt 175.0 lb

## 2022-11-19 DIAGNOSIS — G4733 Obstructive sleep apnea (adult) (pediatric): Secondary | ICD-10-CM

## 2022-11-19 DIAGNOSIS — R0681 Apnea, not elsewhere classified: Secondary | ICD-10-CM

## 2022-11-26 DIAGNOSIS — R0681 Apnea, not elsewhere classified: Secondary | ICD-10-CM

## 2022-11-26 NOTE — Procedures (Signed)
Patient Name: [name], [name] Date: 11/19/2022 Gender: Male D.O.B: 1965/10/11 Age (years): 56 Referring Provider: Cameron Sprang Height (inches): 21 Interpreting Physician: Baird Lyons MD, ABSM Weight (lbs): 175 RPSGT: Zadie Rhine BMI: 27 MRN: [id number] Neck Size: 16.00  CLINICAL INFORMATION Sleep Study Type: NPSG Indication for sleep study: Snoring Epworth Sleepiness Score: 15  SLEEP STUDY TECHNIQUE As per the AASM Manual for the Scoring of Sleep and Associated Events v2.3 (April 2016) with a hypopnea requiring 4% desaturations.  The channels recorded and monitored were frontal, central and occipital EEG, electrooculogram (EOG), submentalis EMG (chin), nasal and oral airflow, thoracic and abdominal wall motion, anterior tibialis EMG, snore microphone, electrocardiogram, and pulse oximetry.  MEDICATIONS Medications self-administered by patient taken the night of the study : none reported  SLEEP ARCHITECTURE The study was initiated at 9:25:31 PM and ended at 4:31:37 AM.  Sleep onset time was 4.4 minutes and the sleep efficiency was 62.8%. The total sleep time was 267.5 minutes.  Stage REM latency was 38.5 minutes.  The patient spent 5.4% of the night in stage N1 sleep, 53.8% in stage N2 sleep, 0.0% in stage N3 and 40.8% in REM.  Alpha intrusion was absent.  Supine sleep was 87.85%.  RESPIRATORY PARAMETERS The overall apnea/hypopnea index (AHI) was 21.3 per hour. There were 12 total apneas, including 9 obstructive, 3 central and 0 mixed apneas. There were 83 hypopneas and 8 RERAs.  The AHI during Stage REM sleep was 33.0 per hour.  AHI while supine was 24.3 per hour.  The mean oxygen saturation was 94.6%. The minimum SpO2 during sleep was 62.0%.  moderate snoring was noted during this study.  CARDIAC DATA The 2 lead EKG demonstrated sinus rhythm. The mean heart rate was 58.2 beats per minute. Other EKG findings include: None.  LEG MOVEMENT  DATA The total PLMS were 0 with a resulting PLMS index of 0.0. Associated arousal with leg movement index was 0.0 .  IMPRESSIONS - Moderate obstructive sleep apnea occurred during this study (AHI = 21.3/h). - No significant central sleep apnea occurred during this study (CAI = 0.7/h). - Oxygen desaturation was noted during this study (Min O2 = 62.0%. Mean 94.6%). Time with O2 saturation 88% or less was 13.2 minutes. - The patient snored with moderate snoring volume. - No cardiac abnormalities were noted during this study. - Clinically significant periodic limb movements did not occur during sleep. No significant associated arousals. - No EEG abnormality/ seizure activity identified. - Tech reported patient unable to sleep after about 02:15 AM - upset about a family issue.  DIAGNOSIS - Obstructive Sleep Apnea (G47.33)  RECOMMENDATIONS - Suggest CPAP titration sleep study or autopap. Other options would be based on clinical judgment. - Positional therapy avoiding supine position during sleep. - Be careful with alcohol, sedatives and other CNS depressants that may worsen sleep apnea and disrupt normal sleep architecture. - Sleep hygiene should be reviewed to assess factors that may improve sleep quality. - Weight management and regular exercise should be initiated or continued if appropriate.  [Electronically signed] 11/26/2022 12:04 PM  Baird Lyons MD, [place], American Board of Sleep Medicine NPI: [id number]                      [place], [employer]  ELECTRONICALLY SIGNED ON:  11/26/2022, 11:55 AM [place] PH: [phone]   FX: [phone]) [place]  OF SLEEP MEDICINE

## 2022-11-28 ENCOUNTER — Telehealth: Payer: Self-pay

## 2022-11-28 DIAGNOSIS — G4733 Obstructive sleep apnea (adult) (pediatric): Secondary | ICD-10-CM

## 2022-11-28 NOTE — Telephone Encounter (Signed)
-----   Message from Cameron Sprang, MD sent at 11/28/2022 12:44 PM EST ----- Pls let patient know the sleep study showed moderate sleep apnea. Recommend follow-up with sleep specialist, pls send referral to Pulmonary, dx: OSA. Thanks  ----- Message ----- From: Deneise Lever, MD Sent: 11/26/2022  12:07 PM EST To: Cameron Sprang, MD

## 2022-11-28 NOTE — Telephone Encounter (Signed)
Pt called informed sleep study showed moderate sleep apnea. Recommend follow-up with sleep specialist, pt is ok with sending referral to pulmonology

## 2022-12-21 ENCOUNTER — Ambulatory Visit (INDEPENDENT_AMBULATORY_CARE_PROVIDER_SITE_OTHER): Payer: 59 | Admitting: Pulmonary Disease

## 2022-12-21 ENCOUNTER — Encounter: Payer: Self-pay | Admitting: Pulmonary Disease

## 2022-12-21 VITALS — BP 106/70 | HR 58 | Ht 68.0 in | Wt 176.4 lb

## 2022-12-21 DIAGNOSIS — G4733 Obstructive sleep apnea (adult) (pediatric): Secondary | ICD-10-CM

## 2022-12-21 NOTE — Progress Notes (Signed)
[name]    [id number]    [birth year]  Primary Care Physician:Jobe, Alexis Goodell., MD  Referring Physician: Cameron Sprang, MD [name],  [place] [address]  Chief complaint:   Patient being seen for sleep apnea  HPI:  Recently had a sleep study showing moderate obstructive sleep apnea with moderately severe oxygen desaturations  History of snoring, history of witnessed apneas Restless sleep Waking up feeling like he has not had a good nights rest Can fall asleep easily during the day  Usually goes to bed between 8 and 9 PM, falls asleep immediately 2-3 awakenings Final wake up time between 4 and 5 AM in the morning  Will has been relatively stable  History of brain injury about 10 years ago associated with seizures  Was seizure-free for many years but in the last year has had some seizures and medications for his seizures are being optimized at present  Feels well otherwise  Remembers that his dad snored  He does wake up with a dry mouth in the morning Memory is good  Reformed smoker quit in 2021  He is self-employed  He does have some nasal stuffiness and congestion   Outpatient Encounter Medications as of 12/21/2022  Medication Sig   ferrous sulfate 324 MG TBEC Take 324 mg by mouth.   levETIRAcetam (KEPPRA XR) 750 MG 24 hr tablet Take 1 tablet every morning   levETIRAcetam (KEPPRA) 100 MG/ML solution Take 15 mLs (1,500 mg total) by mouth daily. 15 ml at night   Multiple Vitamin (MULITIVITAMIN WITH MINERALS) TABS Take 1 tablet by mouth daily.   Omega-3 Fatty Acids (FISH OIL) 1000 MG CAPS Take 1 capsule by mouth daily.   Potassium (POTASSIMIN PO) Take by mouth.   diazePAM, 20 MG Dose, (VALTOCO 20 MG DOSE) 2 x 10 MG/0.1ML LQPK One spray of each device into each nostril as needed for seizure. May use second dose in 4 hours. (Patient not taking: Reported on 12/21/2022)   No facility-administered encounter medications on file as of  12/21/2022.    Allergies as of 12/21/2022   (No Known Allergies)    Past Medical History:  Diagnosis Date   Head injury, closed, with LOC of unknown duration ([place]) 12/13/2011   Heel fracture    Radius and ulna distal fracture 12/13/2011   Rib fractures 12/13/2011    Past Surgical History:  Procedure Laterality Date   CAST APPLICATION  [id number]   Procedure: CAST APPLICATION;  Surgeon: Linna Hoff, MD;  Location: [facility];  Service: Orthopedics;  Laterality: Left;   EYE SURGERY     Fx repair   Heel fracture     'Shattered" repair   NASAL FRACTURE SURGERY     ORIF WRIST FRACTURE  12/20/2011   Procedure: OPEN REDUCTION INTERNAL FIXATION (ORIF) WRIST FRACTURE;  Surgeon: Linna Hoff, MD;  Location: [facility];  Service: Orthopedics;  Laterality: Left;    Family History  Problem Relation Age of Onset   Anesthesia problems Neg Hx     Social History   Socioeconomic History   Marital status: Legally Separated    Spouse name: Not on file   Number of children: Not on file   Years of education: Not on file   Highest education level: Not on file  Occupational History   Not on file  Tobacco Use   Smoking status: Former    Years: 11    Types: Cigarettes  Quit date: 02/23/2005    Years since quitting: 17.8   Smokeless tobacco: Not on file  Vaping Use   Vaping Use: Never used  Substance and Sexual Activity   Alcohol use: No   Drug use: No   Sexual activity: Not on file  Other Topics Concern   Not on file  Social History Narrative   Right handed'   One story home   decaffeine   Social Determinants of Health   Financial Resource Strain: Not on file  Food Insecurity: Not on file  Transportation Needs: Not on file  Physical Activity: Not on file  Stress: Not on file  Social Connections: Not on file  Intimate Partner Violence: Not on file    Review of Systems  Respiratory:  Positive for apnea.   Psychiatric/Behavioral:  Positive for sleep disturbance.     Vitals:    12/21/22 0957  BP: 106/70  Pulse: (!) 58  SpO2: 97%     Physical Exam Constitutional:      Appearance: Normal appearance.  HENT:     Head: Normocephalic.     Mouth/Throat:     Mouth: Mucous membranes are moist.  Eyes:     General: No scleral icterus.    Pupils: Pupils are equal, round, and reactive to light.  Cardiovascular:     Rate and Rhythm: Normal rate and regular rhythm.     Heart sounds: No murmur heard.    No friction rub.  Pulmonary:     Effort: No respiratory distress.     Breath sounds: No stridor. No wheezing or rhonchi.  Chest:     Chest wall: No tenderness.  Musculoskeletal:     Cervical back: No rigidity or tenderness.  Neurological:     Mental Status: He is alert.  Psychiatric:        Mood and Affect: Mood normal.       12/21/2022   10:00 AM  Results of the Epworth flowsheet  Sitting and reading 3  Watching TV 2  Sitting, inactive in a public place (e.g. a theatre or a meeting) 2  As a passenger in a car for an hour without a break 3  Lying down to rest in the afternoon when circumstances permit 3  Sitting and talking to someone 0  Sitting quietly after a lunch without alcohol 3  In a car, while stopped for a few minutes in traffic 0  Total score 16     Data Reviewed: Sleep study 11/19/22 reviewed-moderate obstructive sleep apnea with moderately severe oxygen desaturations  Assessment:  Moderate obstructive sleep apnea  Excessive daytime sleepiness  Nonrestorative sleep  Pathophysiology of sleep disordered breathing discussed with the patient Treatment options discussed with the patient  Plan/Recommendations: DME referral for CPAP therapy  Auto CPAP 5-15 with heated humidification with patient's mask of choice, a fullface mask will be preferred  Encouraged to maintain weight as able  Get an adequate number of hours of sleep, about 7 hours of sleep is recommended  Follow-up in about 8 weeks  Encouraged to call with significant  concerns   Sherrilyn Rist MD [facility] 12/21/2022, 10:21 AM  CC: Cameron Sprang, MD

## 2022-12-21 NOTE — Patient Instructions (Signed)
Will see you in about 6 to 8 weeks  We will contact the medical supply company to start you on a CPAP therapy  DME referral for CPAP therapy-auto CPAP 5-20 with heated humidification with a fullface mask  He does take a little bit of time to get used to using the CPAP  Call us with significant concerns

## 2022-12-26 ENCOUNTER — Telehealth: Payer: Self-pay | Admitting: Pulmonary Disease

## 2022-12-26 DIAGNOSIS — G4733 Obstructive sleep apnea (adult) (pediatric): Secondary | ICD-10-CM

## 2022-12-26 NOTE — Telephone Encounter (Signed)
[facility] is not in network with pt's insurance. Pt contacted insurance and pt insurance is in network with the following DME Companies:   [facility] - [phone] [facility] - [phone]

## 2022-12-27 NOTE — Telephone Encounter (Signed)
Dr. Jenetta Downer see previous message from patient. His insurance is not in network with [facility]. Patient has attacted several DME companies he can use.  Please advise on which company to use?

## 2022-12-31 NOTE — Telephone Encounter (Signed)
Patients preference or can send CPAP order to any DME

## 2023-01-01 NOTE — Telephone Encounter (Signed)
Called and spoke with patient. Advised patient I would resend his cpap order to one of the DME companies he provided that is within his network. Patient verbalized understanding.   Nothing further needed.

## 2023-02-15 ENCOUNTER — Ambulatory Visit: Payer: 59 | Admitting: Pulmonary Disease

## 2023-02-16 ENCOUNTER — Ambulatory Visit: Payer: 59 | Admitting: Neurology

## 2023-02-21 ENCOUNTER — Other Ambulatory Visit: Payer: Self-pay | Admitting: Neurology

## 2023-02-22 ENCOUNTER — Other Ambulatory Visit (HOSPITAL_COMMUNITY): Payer: Self-pay

## 2023-02-22 ENCOUNTER — Telehealth: Payer: Self-pay | Admitting: Pharmacy Technician

## 2023-02-22 NOTE — Telephone Encounter (Signed)
Patient Advocate Encounter  Received notification from [facility] that prior authorization for VALTOCO 20MG  is required.   PA submitted on 5.30.24 [facility] Status is pending

## 2023-02-28 MED ORDER — VALTOCO 20 MG DOSE 10 MG/0.1ML NA LQPK: NASAL | 5 refills | Status: AC

## 2023-02-28 NOTE — Addendum Note (Signed)
Addended by: Van Clines on: 02/28/2023 02:17 PM   Modules accepted: Orders

## 2023-02-28 NOTE — Addendum Note (Signed)
Addended by: Dimas Chyle on: 02/28/2023 02:01 PM   Modules accepted: Orders

## 2023-02-28 NOTE — Telephone Encounter (Signed)
Patient Advocate Encounter  Prior Authorization for Valtoco 20 MG Dose 10MG /0.1ML liquid has been approved through [employer].    KeyKarna Wolf  Effective: 02-22-2023 to 02-22-2024

## 2023-03-06 ENCOUNTER — Ambulatory Visit: Payer: 59 | Admitting: Neurology

## 2023-03-07 ENCOUNTER — Telehealth: Payer: Self-pay | Admitting: Neurology

## 2023-03-07 NOTE — Telephone Encounter (Signed)
[facility] pharmacy tech states they are needing clarification on duration and qty of rx valtaco 20mg . It's written as "5each qty", states they come in packs of 2.  Full access nurse report is in Dr. Rosalyn Gess box

## 2023-03-07 NOTE — Telephone Encounter (Signed)
I thought it was 5 of the 2 packs. Can you pls confirm with Valtoco rep? Thanks

## 2023-03-07 NOTE — Telephone Encounter (Signed)
Drug rep called left a voice mail to call back

## 2023-03-08 NOTE — Telephone Encounter (Signed)
Pharmacy called informed that it shouldbe 5 boxed with 10 blister packs 30 day supply

## 2023-04-04 ENCOUNTER — Ambulatory Visit: Payer: 59 | Admitting: Pulmonary Disease

## 2023-04-11 ENCOUNTER — Other Ambulatory Visit: Payer: Self-pay | Admitting: Neurology

## 2023-05-28 ENCOUNTER — Other Ambulatory Visit: Payer: Self-pay | Admitting: Neurology

## 2023-07-18 ENCOUNTER — Other Ambulatory Visit: Payer: Self-pay | Admitting: Neurology

## 2023-08-10 DIAGNOSIS — G4733 Obstructive sleep apnea (adult) (pediatric): Secondary | ICD-10-CM | POA: Insufficient documentation

## 2023-08-10 NOTE — Progress Notes (Unsigned)
New patient visit   Patient: [name]   DOB: March 30, 1966   57 y.o. Male  MRN: [id number] Visit Date: 08/13/2023  Today's healthcare provider: Alfredia Ferguson, PA-C   No chief complaint on file.  Subjective    [name] is a 57 y.o. male who presents today as a new patient to establish care.   Eplisey, neuro  MRI brain shows right temporal encephalomalacia. Routine and 24-hour EEG normal. 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   Osa, pulm should be on cpap Past Medical History:  Diagnosis Date   Head injury, closed, with LOC of unknown duration (HCC) 12/13/2011   Heel fracture    Radius and ulna distal fracture 12/13/2011   Rib fractures 12/13/2011   Past Surgical History:  Procedure Laterality Date   CAST APPLICATION  12/20/2011   Procedure: CAST APPLICATION;  Surgeon: Sharma Covert, MD;  Location: [facility] OR;  Service: Orthopedics;  Laterality: Left;   EYE SURGERY     Fx repair   Heel fracture     'Shattered" repair   NASAL FRACTURE SURGERY     ORIF WRIST FRACTURE  12/20/2011   Procedure: OPEN REDUCTION INTERNAL FIXATION (ORIF) WRIST FRACTURE;  Surgeon: Sharma Covert, MD;  Location: [facility] OR;  Service: Orthopedics;  Laterality: Left;   Family Status  Relation Name Status   Father  Alive   Neg Hx  (Not Specified)  No partnership data on file   Family History  Problem Relation Age of Onset   Anesthesia problems Neg Hx    Social History   Socioeconomic History   Marital status: Legally Separated    Spouse name: Not on file   Number of children: Not on file   Years of education: Not on file   Highest education level: Not on file  Occupational History   Not on file  Tobacco Use   Smoking status: Former    Current packs/day: 0.00    Types: Cigarettes    Start date: 02/23/1994    Quit date: 02/23/2005    Years since quitting: 18.4   Smokeless tobacco: Not on file  Vaping Use   Vaping status:  Never Used  Substance and Sexual Activity   Alcohol use: No   Drug use: No   Sexual activity: Not on file  Other Topics Concern   Not on file  Social History Narrative   Right handed'   One story home   decaffeine   Social Determinants of Health   Financial Resource Strain: Not on file  Food Insecurity: No Food Insecurity (04/27/2021)   Received from [facility], [facility]   Hunger Vital Sign    Worried About Running Out of Food in the Last Year: Never true    Ran Out of Food in the Last Year: Never true  Transportation Needs: Not on file  Physical Activity: Not on file  Stress: Not on file  Social Connections: Unknown (02/04/2022)   Received from [facility], [facility]   Social Network    Social Network: Not on file   Outpatient Medications Prior to Visit  Medication Sig   diazePAM, 20 MG Dose, (VALTOCO 20 MG DOSE) 2 x 10 MG/0.1ML LQPK One spray of each device into each nostril as needed for seizure. May use second dose in 4 hours.   ferrous sulfate 324 MG TBEC Take 324 mg by mouth.   levETIRAcetam (KEPPRA XR) 750 MG 24 hr tablet  Take 1 tablet every morning   levETIRAcetam (KEPPRA) 100 MG/ML solution TAKE 10 ML BY MOUTH TWICE A DAY AS DIRECTED   Multiple Vitamin (MULITIVITAMIN WITH MINERALS) TABS Take 1 tablet by mouth daily.   Omega-3 Fatty Acids (FISH OIL) 1000 MG CAPS Take 1 capsule by mouth daily.   Potassium (POTASSIMIN PO) Take by mouth.   No facility-administered medications prior to visit.   No Known Allergies   There is no immunization history on file for this patient.  Health Maintenance  Topic Date Due   HIV Screening  Never done   Hepatitis C Screening  Never done   DTaP/Tdap/Td (1 - Tdap) Never done   Colonoscopy  Never done   Zoster Vaccines- Shingrix (1 of 2) Never done   INFLUENZA VACCINE  Never done   COVID-19 Vaccine (1 - 2023-24 season) Never done   HPV VACCINES  Aged Out    Patient Care Team: Malka So., MD as PCP - General  (Internal Medicine) Van Clines, MD as Consulting Physician (Neurology)  Review of Systems  {Insert previous labs (optional):23779} {See past labs  Heme  Chem  Endocrine  Serology  Results Review (optional):1}   Objective    There were no vitals taken for this visit. {Insert last BP/Wt (optional):23777}{See vitals history (optional):1}   Physical Exam ***  Depression Screen     No data to display         No results found for any visits on 08/13/23.  Assessment & Plan     There are no diagnoses linked to this encounter.   No follow-ups on file.      Alfredia Ferguson, PA-C  [facility] at [facility] [phone] (phone) [phone] (fax)  [facility]

## 2023-08-13 ENCOUNTER — Encounter: Payer: Self-pay | Admitting: Physician Assistant

## 2023-08-13 ENCOUNTER — Telehealth: Payer: Self-pay | Admitting: Physician Assistant

## 2023-08-13 ENCOUNTER — Ambulatory Visit: Payer: 59 | Admitting: Physician Assistant

## 2023-08-13 VITALS — BP 123/84 | HR 71 | Ht 68.0 in | Wt 171.1 lb

## 2023-08-13 DIAGNOSIS — R739 Hyperglycemia, unspecified: Secondary | ICD-10-CM

## 2023-08-13 DIAGNOSIS — L649 Androgenic alopecia, unspecified: Secondary | ICD-10-CM | POA: Diagnosis not present

## 2023-08-13 DIAGNOSIS — G40909 Epilepsy, unspecified, not intractable, without status epilepticus: Secondary | ICD-10-CM

## 2023-08-13 DIAGNOSIS — Z1159 Encounter for screening for other viral diseases: Secondary | ICD-10-CM | POA: Diagnosis not present

## 2023-08-13 DIAGNOSIS — Z125 Encounter for screening for malignant neoplasm of prostate: Secondary | ICD-10-CM

## 2023-08-13 DIAGNOSIS — Z114 Encounter for screening for human immunodeficiency virus [HIV]: Secondary | ICD-10-CM

## 2023-08-13 LAB — CBC WITH DIFFERENTIAL/PLATELET
Basophils Absolute: 0 10*3/uL (ref 0.0–0.1)
Basophils Relative: 0.4 % (ref 0.0–3.0)
Eosinophils Absolute: 0 10*3/uL (ref 0.0–0.7)
Eosinophils Relative: 0.3 % (ref 0.0–5.0)
HCT: 44.7 % (ref 39.0–52.0)
Hemoglobin: 15 g/dL (ref 13.0–17.0)
Lymphocytes Relative: 17.3 % (ref 12.0–46.0)
Lymphs Abs: 0.8 10*3/uL (ref 0.7–4.0)
MCHC: 33.6 g/dL (ref 30.0–36.0)
MCV: 99.9 fL (ref 78.0–100.0)
Monocytes Absolute: 0.4 10*3/uL (ref 0.1–1.0)
Monocytes Relative: 9.2 % (ref 3.0–12.0)
Neutro Abs: 3.4 10*3/uL (ref 1.4–7.7)
Neutrophils Relative %: 72.8 % (ref 43.0–77.0)
Platelets: 237 10*3/uL (ref 150.0–400.0)
RBC: 4.47 Mil/uL (ref 4.22–5.81)
RDW: 11.7 % (ref 11.5–15.5)
WBC: 4.6 10*3/uL (ref 4.0–10.5)

## 2023-08-13 LAB — COMPREHENSIVE METABOLIC PANEL
ALT: 17 U/L (ref 0–53)
AST: 18 U/L (ref 0–37)
Albumin: 4.4 g/dL (ref 3.5–5.2)
Alkaline Phosphatase: 56 U/L (ref 39–117)
BUN: 20 mg/dL (ref 6–23)
CO2: 28 meq/L (ref 19–32)
Calcium: 9.7 mg/dL (ref 8.4–10.5)
Chloride: 103 meq/L (ref 96–112)
Creatinine, Ser: 1.1 mg/dL (ref 0.40–1.50)
GFR: 73.92 mL/min (ref >60.00)
Glucose, Bld: 79 mg/dL (ref 70–99)
Potassium: 5.4 meq/L — ABNORMAL HIGH (ref 3.5–5.1)
Sodium: 139 meq/L (ref 135–145)
Total Bilirubin: 0.6 mg/dL (ref 0.2–1.2)
Total Protein: 7.2 g/dL (ref 6.0–8.3)

## 2023-08-13 LAB — LIPID PANEL
Cholesterol: 194 mg/dL (ref 0–200)
HDL: 48.2 mg/dL (ref >39.00)
LDL Cholesterol: 105 mg/dL — ABNORMAL HIGH (ref 0–99)
NonHDL: 146.01
Total CHOL/HDL Ratio: 4
Triglycerides: 203 mg/dL — ABNORMAL HIGH (ref 0.0–149.0)
VLDL: 40.6 mg/dL — ABNORMAL HIGH (ref 0.0–40.0)

## 2023-08-13 LAB — PSA: PSA: 0.88 ng/mL (ref 0.10–4.00)

## 2023-08-13 LAB — HEMOGLOBIN A1C: Hgb A1c MFr Bld: 6.3 % (ref 4.6–6.5)

## 2023-08-13 MED ORDER — MINOXIDIL 5 % EX FOAM: CUTANEOUS | 1 refills | Status: AC

## 2023-08-13 MED ORDER — FINASTERIDE 1 MG PO TABS: 1.0000 mg | ORAL_TABLET | Freq: Every day | ORAL | 0 refills | Status: DC

## 2023-08-13 NOTE — Telephone Encounter (Signed)
Patient called and states finasteride (PROPECIA) 1 MG tablet  is not covered by insurance he went to check and was 108.00. he states is confused if provider said was covered. Please advise.

## 2023-08-13 NOTE — Assessment & Plan Note (Signed)
Pt is managed on keppra and diazepam. Follows with neurology.  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

## 2023-08-13 NOTE — Assessment & Plan Note (Signed)
Recommending topical minoxidil and oral finasteride.  Ordering cbc, cmp today.

## 2023-08-14 ENCOUNTER — Other Ambulatory Visit: Payer: Self-pay | Admitting: Physician Assistant

## 2023-08-14 ENCOUNTER — Encounter: Payer: Self-pay | Admitting: Physician Assistant

## 2023-08-14 DIAGNOSIS — L649 Androgenic alopecia, unspecified: Secondary | ICD-10-CM

## 2023-08-14 DIAGNOSIS — E875 Hyperkalemia: Secondary | ICD-10-CM

## 2023-08-14 LAB — HEPATITIS C ANTIBODY: Hepatitis C Ab: NONREACTIVE

## 2023-08-14 LAB — HIV ANTIBODY (ROUTINE TESTING W REFLEX): HIV 1&2 Ab, 4th Generation: NONREACTIVE

## 2023-08-14 MED ORDER — DUTASTERIDE 0.5 MG PO CAPS: 0.5000 mg | ORAL_CAPSULE | Freq: Every day | ORAL | 1 refills | Status: AC

## 2023-08-14 NOTE — Telephone Encounter (Signed)
Sent in alternative

## 2023-08-14 NOTE — Telephone Encounter (Signed)
Error

## 2023-08-17 ENCOUNTER — Other Ambulatory Visit (INDEPENDENT_AMBULATORY_CARE_PROVIDER_SITE_OTHER): Payer: 59

## 2023-08-17 ENCOUNTER — Telehealth: Payer: Self-pay | Admitting: *Deleted

## 2023-08-17 DIAGNOSIS — E875 Hyperkalemia: Secondary | ICD-10-CM

## 2023-08-17 LAB — BASIC METABOLIC PANEL
BUN: 18 mg/dL (ref 6–23)
CO2: 31 meq/L (ref 19–32)
Calcium: 9.2 mg/dL (ref 8.4–10.5)
Chloride: 101 meq/L (ref 96–112)
Creatinine, Ser: 1.03 mg/dL (ref 0.40–1.50)
GFR: 79.98 mL/min (ref >60.00)
Glucose, Bld: 99 mg/dL (ref 70–99)
Potassium: 4.4 meq/L (ref 3.5–5.1)
Sodium: 140 meq/L (ref 135–145)

## 2023-08-17 NOTE — Telephone Encounter (Signed)
Patient came for lab recheck (potassium).  He wanted you to know that he has been taking supplements with potassium in it.    The dose below is the potassium:dose Muscle milk protein powder- 300mg .  He last took yesterday Potassium- 99mg .  Last took 2 days a ago Multivitamin- 80mg - Last took 2 days ago.  He wanted to toss the multivitamin and potassium and continue taking the muscle milk powder.    I advised to await to hear from you first.

## 2023-08-17 NOTE — Telephone Encounter (Signed)
Spoke with patient. Advised understanding

## 2023-09-01 ENCOUNTER — Other Ambulatory Visit: Payer: Self-pay | Admitting: Neurology

## 2023-09-14 ENCOUNTER — Encounter: Payer: Self-pay | Admitting: Orthopaedic Surgery

## 2023-09-14 ENCOUNTER — Other Ambulatory Visit (INDEPENDENT_AMBULATORY_CARE_PROVIDER_SITE_OTHER): Payer: Self-pay

## 2023-09-14 ENCOUNTER — Ambulatory Visit (INDEPENDENT_AMBULATORY_CARE_PROVIDER_SITE_OTHER): Payer: 59 | Admitting: Orthopaedic Surgery

## 2023-09-14 DIAGNOSIS — M25511 Pain in right shoulder: Secondary | ICD-10-CM

## 2023-09-14 DIAGNOSIS — G8929 Other chronic pain: Secondary | ICD-10-CM

## 2023-09-14 NOTE — Progress Notes (Signed)
Office Visit Note   Patient: [name]           Date of Birth: 07-Nov-1965           MRN: [id number] Visit Date: 09/14/2023              Requested by: Alfredia Ferguson, PA-C [address] [place],  [place] [address] PCP: Alfredia Ferguson, PA-C   Assessment & Plan: Visit Diagnoses:  1. Chronic right shoulder pain     Plan: Impression is 57 year old gentleman with a partial right supraspinatus tear.  Sounds like everything is getting better overall.  His shoulder is functional.  I would rested for another 6 weeks and I have provided some home exercises for specific rotator cuff strengthening.  If symptoms do not improve will need to obtain an MRI.  Follow-Up Instructions: No follow-ups on file.   Orders:  Orders Placed This Encounter  Procedures   XR Shoulder Right   No orders of the defined types were placed in this encounter.     Procedures: No procedures performed   Clinical Data: No additional findings.   Subjective: Chief Complaint  Patient presents with   Right Shoulder - Pain    HPI [name] is a 57 year old gentleman who comes in for evaluation of right shoulder pain for about a month.  This has gotten a little bit better but it started acutely when he was doing one-handed push-ups and felt something pop in his shoulder after about 20 to push-ups.  He is right-hand dominant.  He felt pain in the lateral deltoid and initially felt weakness when raising his arm.  Denies any neck symptoms. Review of Systems  Constitutional: Negative.   HENT: Negative.    Eyes: Negative.   Respiratory: Negative.    Cardiovascular: Negative.   Gastrointestinal: Negative.   Endocrine: Negative.   Genitourinary: Negative.   Skin: Negative.   Allergic/Immunologic: Negative.   Neurological: Negative.   Hematological: Negative.   Psychiatric/Behavioral: Negative.    All other systems reviewed and are negative.    Objective: Vital Signs: There were no  vitals taken for this visit.  Physical Exam Vitals and nursing note reviewed.  Constitutional:      Appearance: He is well-developed.  HENT:     Head: Normocephalic and atraumatic.  Eyes:     Pupils: Pupils are equal, round, and reactive to light.  Pulmonary:     Effort: Pulmonary effort is normal.  Abdominal:     Palpations: Abdomen is soft.  Musculoskeletal:        General: Normal range of motion.     Cervical back: Neck supple.  Skin:    General: Skin is warm.  Neurological:     Mental Status: He is alert and oriented to person, place, and time.  Psychiatric:        Behavior: Behavior normal.        Thought Content: Thought content normal.        Judgment: Judgment normal.     Ortho Exam Exam of the right shoulder shows normal passive and active range of motion.  She he has slight weakness and a slight shrug with manual muscle testing of the supraspinatus.  All other portions of the exam are normal. Specialty Comments:  No specialty comments available.  Imaging: XR Shoulder Right Result Date: 09/14/2023 X-rays of the right shoulder show no acute abnormalities.  Mild AC joint arthritis.    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   Past Medical History:  Diagnosis Date   Head injury, closed, with LOC of unknown duration (HCC) 12/13/2011   Heel fracture    Radius and ulna distal fracture 12/13/2011   Rib fractures 12/13/2011    Family History  Problem Relation Age of Onset   Anesthesia problems Neg Hx     Past Surgical History:  Procedure Laterality Date   CAST APPLICATION  12/20/2011   Procedure: CAST APPLICATION;  Surgeon: Sharma Covert, MD;  Location: [facility] OR;  Service: Orthopedics;  Laterality: Left;   EYE SURGERY     Fx repair   Heel fracture     'Shattered" repair   NASAL  FRACTURE SURGERY     ORIF WRIST FRACTURE  12/20/2011   Procedure: OPEN REDUCTION INTERNAL FIXATION (ORIF) WRIST FRACTURE;  Surgeon: Sharma Covert, MD;  Location: [facility] OR;  Service: Orthopedics;  Laterality: Left;   Social History   Occupational History   Not on file  Tobacco Use   Smoking status: Every Day    Current packs/day: 0.20    Average packs/day: 1 pack/day for 23.6 years (22.8 ttl pk-yrs)    Types: Cigarettes    Start date: 02/23/1994    Last attempt to quit: 2018   Smokeless tobacco: Not on file  Vaping Use   Vaping status: Never Used  Substance and Sexual Activity   Alcohol use: No   Drug use: No   Sexual activity: Not on file

## 2023-09-26 ENCOUNTER — Other Ambulatory Visit: Payer: Self-pay | Admitting: Neurology

## 2023-10-08 ENCOUNTER — Telehealth: Payer: Self-pay | Admitting: Neurology

## 2023-10-08 NOTE — Telephone Encounter (Signed)
 Pt called stated he had increased dizziness with movement, pt asked if he has called or seen his PCP he has not, pt has not checked his BP with he has had the dizzy spells he doesn't have a cuff he stated that [name] use to check but she is at the beach and he is here. Pt advised to call his PCP so that they can check his ears and his BP pt stated that he will call them to make an appointment.

## 2023-10-08 NOTE — Telephone Encounter (Signed)
 Caller stated he's been having really bad, scary dizzy spells. Doesn't know if its from the medication or not. Would like feedback from nurse

## 2023-10-08 NOTE — Telephone Encounter (Signed)
 Agree, thanks

## 2023-10-25 ENCOUNTER — Encounter: Payer: Self-pay | Admitting: Neurology

## 2023-10-25 ENCOUNTER — Ambulatory Visit (INDEPENDENT_AMBULATORY_CARE_PROVIDER_SITE_OTHER): Payer: Medicaid Other | Admitting: Neurology

## 2023-10-25 VITALS — BP 120/80 | HR 65 | Ht 68.0 in | Wt 167.8 lb

## 2023-10-25 DIAGNOSIS — G40209 Localization-related (focal) (partial) symptomatic epilepsy and epileptic syndromes with complex partial seizures, not intractable, without status epilepticus: Secondary | ICD-10-CM | POA: Diagnosis not present

## 2023-10-25 MED ORDER — LEVETIRACETAM ER 750 MG PO TB24: ORAL_TABLET | ORAL | 3 refills | Status: AC

## 2023-10-25 MED ORDER — LEVETIRACETAM 100 MG/ML PO SOLN: ORAL | 3 refills | Status: DC
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
## 2024-03-12 ENCOUNTER — Emergency Department (HOSPITAL_BASED_OUTPATIENT_CLINIC_OR_DEPARTMENT_OTHER)

## 2024-03-12 ENCOUNTER — Encounter (HOSPITAL_BASED_OUTPATIENT_CLINIC_OR_DEPARTMENT_OTHER): Payer: Self-pay | Admitting: Emergency Medicine

## 2024-03-12 ENCOUNTER — Ambulatory Visit: Payer: Self-pay

## 2024-03-12 ENCOUNTER — Other Ambulatory Visit: Payer: Self-pay

## 2024-03-12 ENCOUNTER — Emergency Department (HOSPITAL_BASED_OUTPATIENT_CLINIC_OR_DEPARTMENT_OTHER)
Admission: EM | Admit: 2024-03-12 | Discharge: 2024-03-12 | Disposition: A | Discharge status: Home / Self Care | Attending: Emergency Medicine | Admitting: Emergency Medicine

## 2024-03-12 DIAGNOSIS — R202 Paresthesia of skin: Secondary | ICD-10-CM | POA: Insufficient documentation

## 2024-03-12 LAB — COMPREHENSIVE METABOLIC PANEL WITH GFR
ALT: 44 U/L (ref 0–44)
AST: 67 U/L — ABNORMAL HIGH (ref 15–41)
Albumin: 4.3 g/dL (ref 3.5–5.0)
Alkaline Phosphatase: 40 U/L (ref 38–126)
Anion gap: 11 (ref 5–15)
BUN: 15 mg/dL (ref 6–20)
CO2: 26 mmol/L (ref 22–32)
Calcium: 9.3 mg/dL (ref 8.9–10.3)
Chloride: 101 mmol/L (ref 98–111)
Creatinine, Ser: 1.06 mg/dL (ref 0.61–1.24)
GFR, Estimated: >60 mL/min (ref >60)
Glucose, Bld: 92 mg/dL (ref 70–99)
Potassium: 4 mmol/L (ref 3.5–5.1)
Sodium: 138 mmol/L (ref 135–145)
Total Bilirubin: 0.7 mg/dL (ref 0.0–1.2)
Total Protein: 7.2 g/dL (ref 6.5–8.1)

## 2024-03-12 LAB — URINALYSIS, ROUTINE W REFLEX MICROSCOPIC
Bilirubin Urine: NEGATIVE
Glucose, UA: NEGATIVE mg/dL
Hgb urine dipstick: NEGATIVE
Ketones, ur: 15 mg/dL — AB
Leukocytes,Ua: NEGATIVE
Nitrite: NEGATIVE
Protein, ur: NEGATIVE mg/dL
Specific Gravity, Urine: 1.015 (ref 1.005–1.030)
pH: 7 (ref 5.0–8.0)

## 2024-03-12 LAB — CBC
HCT: 43.4 % (ref 39.0–52.0)
Hemoglobin: 15 g/dL (ref 13.0–17.0)
MCH: 33.3 pg (ref 26.0–34.0)
MCHC: 34.6 g/dL (ref 30.0–36.0)
MCV: 96.2 fL (ref 80.0–100.0)
Platelets: 217 10*3/uL (ref 150–400)
RBC: 4.51 MIL/uL (ref 4.22–5.81)
RDW: 11.3 % — ABNORMAL LOW (ref 11.5–15.5)
WBC: 5.4 10*3/uL (ref 4.0–10.5)
nRBC: 0 % (ref 0.0–0.2)

## 2024-03-12 MED ORDER — METHOCARBAMOL 500 MG PO TABS: 500.0000 mg | ORAL_TABLET | Freq: Two times a day (BID) | ORAL | 0 refills | Status: AC | PRN

## 2024-03-12 MED ORDER — LACTATED RINGERS IV BOLUS
1000.0000 mL | Freq: Once | INTRAVENOUS | Status: AC
  Administered 2024-03-12: 1000 mL via INTRAVENOUS

## 2024-03-12 MED ORDER — LIDOCAINE 5 % EX PTCH
1.0000 | MEDICATED_PATCH | CUTANEOUS | Status: DC
  Filled 2024-03-12: qty 1

## 2024-03-12 MED ORDER — KETOROLAC TROMETHAMINE 15 MG/ML IJ SOLN
15.0000 mg | Freq: Once | INTRAMUSCULAR | Status: DC
  Filled 2024-03-12: qty 1

## 2024-03-12 NOTE — Discharge Instructions (Addendum)
 You were seen in the ER today for evaluation of your symptoms. Please make sure that you are taking your seizure medications as prescribed. Make sure that you are staying hydrated drinking plenty of fluids, mainly water. You can try Tylenol  or ibuprofen as needed for your symptoms. I have sent you in a few muscle relaxers as well. Please do not drive or operate heavy machinery while on the medication. If you think you did have a seizure, per the state of [place] , you can not drive until you have been seizure free for 6 months. Please follow up with you primary care provider. If you have any concerns, new or worsening symptoms, please return to the nearest ER for re-evaluation.    Department of Motor Vehicle (DMV) of [place] regulations for seizures - It is the patient's responsibility to report the incidence of the seizure in the state of [place]. [place]  has no statutory provision requiring physicians to report patients diagnosed with epilepsy or seizures to a central state agency.  The recommended DMV regulation requirement for a driver in [place] for an individual with a seizure is that they be seizure-free for 6-12 months. However, the DMV may consider the following exceptions to this general rule where: (1) a physician-directed change in medication causes a seizure and the individual immediately resumes the previous therapy which controlled seizures; (2) there is a history of nocturnal seizures or seizures which do not involve loss of consciousness, loss of control of motor function, or loss of appropriate sensation and information process; and (3) an individual has a seizure disorder preceded by an aura (warning) lasting 2-3 minutes. While the [facility] may also give consideration to other unusual circumstances which may affect the general requirement that drivers be seizure-free for 6-12 months, interpretation of these circumstances and assignment of restrictions is at the discretion of the Medical Advisor. The  DMV also considers compliance with medical therapy essential for safe driving. [The [place]  Physician's Guide to [employer] (June, 1995 ed.)] The Department learns of an individual's condition by inquiring on the application form or renewal form, a physician's report to the [facility], an accident report or from correspondence from the individual. The person may be required to submit a Medical Report Form either annually or semi-annually.  Contact a doctor if: You have paresthesia that gets worse or does not go away. You lose feeling (have numbness) after an injury. Your burning or prickling feeling gets worse when you walk. You have pain or cramps. You feel dizzy or you faint. You have a rash. Get help right away if: You feel weak or have new weakness in an arm or leg. You have trouble walking or moving. You have problems speaking, understanding, or seeing. You feel confused. You cannot control when you pee (urinate) or poop (have a bowel movement). These symptoms may be an emergency. Get help right away. Call 911. Do not wait to see if the symptoms will go away. Do not drive yourself to the hospital.

## 2024-03-12 NOTE — ED Notes (Addendum)
 Pt states he has weaned himself off his seizure medication because he hasn't had a seizure in a while.  Pt recently had syncopal episode and believes he may have had a seizure.  Pt states he is unsure of LOC but knows he hit his head as he has a bruise over the bridge of his nose and a knot at the back of his head.  Pt has restarted his seizure medications.  Pt is c/o hip pain as well.  Pt c/o left arm numbness which has been ongoing intermittently but worse lately.  Pt states he did not sleep at all last night.

## 2024-03-12 NOTE — ED Triage Notes (Signed)
 Pt POV steady gait- c/o L arm numbness x2 days. When BLUE assessed for sensation, pt reports both sides feel same.  Reports L leg numb 2 days ago, but improved. Reports has been weaning self of seizure meds, emesis x2 occurred Monday AM.  Reports it feels like L hand is asleep, pt has full ROM with all extremities, using hands repeatedly for gesturing while talking.   Denies known injury.   AOx4, speech clear. VAN neg.

## 2024-03-12 NOTE — ED Notes (Addendum)
 Patient transported to CT

## 2024-03-12 NOTE — ED Provider Notes (Incomplete)
 [place] [facility] Provider Note   CSN: [id number] Arrival date & time: 03/12/24  1618     Patient presents with: Numbness   [name] is a 58 y.o. male.  {Add pertinent medical, surgical, social history, OB history to HPI:32947} HPI     Prior to Admission medications   Medication Sig Start Date End Date Taking? Authorizing Provider  diazePAM , 20 MG Dose, (VALTOCO  20 MG DOSE) 2 x 10 MG/0.1ML LQPK One spray of each device into each nostril as needed for seizure. May use second dose in 4 hours. 02/28/23   Jhonny Moss, MD  dutasteride  (AVODART ) 0.5 MG capsule Take 1 capsule (0.5 mg total) by mouth daily. 08/14/23   Trenton Frock, PA-C  ferrous sulfate 324 MG TBEC Take 324 mg by mouth.    [provider]  levETIRAcetam  (KEPPRA  XR) 750 MG 24 hr tablet Take 1 tablet every morning 10/25/23   Aquino, Karen M, MD  levETIRAcetam  (KEPPRA ) 100 MG/ML solution Take 15mL every night 10/25/23   Jhonny Moss, MD  Minoxidil  5 % FOAM Apply 1/2 a capful twice daily 08/13/23   Drubel, Heidi Llamas, PA-C  Multiple Vitamin (MULITIVITAMIN WITH MINERALS) TABS Take 1 tablet by mouth daily.    [provider]    Allergies: Patient has no known allergies.    Review of Systems  Updated Vital Signs BP 130/72 (BP Location: Right Arm)   Pulse 77   Temp 99.1 F (37.3 C)   Resp 18   Ht 5' 8 (1.727 m)   Wt 78 kg   SpO2 96%   BMI 26.15 kg/m   Physical Exam  (all labs ordered are listed, but only abnormal results are displayed) Labs Reviewed  COMPREHENSIVE METABOLIC PANEL WITH GFR - Abnormal; Notable for the following components:      Result Value   AST 67 (*)    All other components within normal limits  CBC - Abnormal; Notable for the following components:   RDW 11.3 (*)    All other components within normal limits  URINALYSIS, ROUTINE W REFLEX MICROSCOPIC - Abnormal; Notable for the following components:   Ketones, ur 15 (*)     All other components within normal limits    EKG: None  Radiology: CT Head Wo Contrast Result Date: 03/12/2024 CLINICAL DATA:  Head trauma, moderate-severe h/o seizure; Neck trauma, dangerous injury mechanism (Age 18-64y) parathesias to the left lower arm EXAM: CT HEAD WITHOUT CONTRAST CT CERVICAL SPINE WITHOUT CONTRAST TECHNIQUE: Multidetector CT imaging of the head and cervical spine was performed following the standard protocol without intravenous contrast. Multiplanar CT image reconstructions of the cervical spine were also generated. RADIATION DOSE REDUCTION: This exam was performed according to the departmental dose-optimization program which includes automated exposure control, adjustment of the mA and/or kV according to patient size and/or use of iterative reconstruction technique. COMPARISON:  CT head 12/14/2011, MR head 12/13/2011 FINDINGS: CT HEAD FINDINGS Brain: No evidence of large-territorial acute infarction. No parenchymal hemorrhage. No mass lesion. No extra-axial collection. No mass effect or midline shift. No hydrocephalus. Basilar cisterns are patent. Vascular: No hyperdense vessel. Skull: No acute fracture or focal lesion. Sinuses/Orbits: Paranasal sinuses and mastoid air cells are clear. The orbits are unremarkable. Other: None. CT CERVICAL SPINE FINDINGS Alignment: Normal. Skull base and vertebrae: Multilevel mild-to-moderate degenerative changes spine. No associated severe osseous neural foraminal or central canal stenosis. Right temporal lobe encephalomalacia. No acute fracture. No aggressive appearing focal osseous lesion or  focal pathologic process. Soft tissues and spinal canal: No prevertebral fluid or swelling. No visible canal hematoma. Upper chest: Unremarkable. Other: None. IMPRESSION: 1. No acute intracranial abnormality. 2. No acute displaced fracture or traumatic listhesis of the cervical spine. Electronically Signed   By: Morgane  Naveau M.D.   On: 03/12/2024 19:22   CT  Cervical Spine Wo Contrast Result Date: 03/12/2024 CLINICAL DATA:  Head trauma, moderate-severe h/o seizure; Neck trauma, dangerous injury mechanism (Age 22-64y) parathesias to the left lower arm EXAM: CT HEAD WITHOUT CONTRAST CT CERVICAL SPINE WITHOUT CONTRAST TECHNIQUE: Multidetector CT imaging of the head and cervical spine was performed following the standard protocol without intravenous contrast. Multiplanar CT image reconstructions of the cervical spine were also generated. RADIATION DOSE REDUCTION: This exam was performed according to the departmental dose-optimization program which includes automated exposure control, adjustment of the mA and/or kV according to patient size and/or use of iterative reconstruction technique. COMPARISON:  CT head 12/14/2011, MR head 12/13/2011 FINDINGS: CT HEAD FINDINGS Brain: No evidence of large-territorial acute infarction. No parenchymal hemorrhage. No mass lesion. No extra-axial collection. No mass effect or midline shift. No hydrocephalus. Basilar cisterns are patent. Vascular: No hyperdense vessel. Skull: No acute fracture or focal lesion. Sinuses/Orbits: Paranasal sinuses and mastoid air cells are clear. The orbits are unremarkable. Other: None. CT CERVICAL SPINE FINDINGS Alignment: Normal. Skull base and vertebrae: Multilevel mild-to-moderate degenerative changes spine. No associated severe osseous neural foraminal or central canal stenosis. Right temporal lobe encephalomalacia. No acute fracture. No aggressive appearing focal osseous lesion or focal pathologic process. Soft tissues and spinal canal: No prevertebral fluid or swelling. No visible canal hematoma. Upper chest: Unremarkable. Other: None. IMPRESSION: 1. No acute intracranial abnormality. 2. No acute displaced fracture or traumatic listhesis of the cervical spine. Electronically Signed   By: Morgane  Naveau M.D.   On: 03/12/2024 19:22    {Document cardiac monitor, telemetry assessment procedure when  appropriate:32947} Procedures   Medications Ordered in the ED  lactated ringers  bolus 1,000 mL (1,000 mLs Intravenous New Bag/Given 03/12/24 1848)      {Click here for ABCD2, HEART and other calculators REFRESH Note before signing:1}                              Medical Decision Making Amount and/or Complexity of Data Reviewed Labs: ordered. Radiology: ordered.   ***  {Document critical care time when appropriate  Document review of labs and clinical decision tools ie CHADS2VASC2, etc  Document your independent review of radiology images and any outside records  Document your discussion with family members, caretakers and with consultants  Document social determinants of health affecting pt's care  Document your decision making why or why not admission, treatments were needed:32947:::1}   Final diagnoses:  None    ED Discharge Orders     None

## 2024-03-12 NOTE — Telephone Encounter (Signed)
FYI. Pt going to ED.  

## 2024-03-12 NOTE — Telephone Encounter (Signed)
 FYI Only or Action Required?: FYI only for provider  Patient was last seen in primary care on 08/14/23. Called Nurse Triage reporting Neurologic Problem. Symptoms began 2 days ago. Interventions attempted: Nothing. Symptoms are: gradually worsening.  Triage Disposition: Go to ED or PCP/Alternative with Approval  Patient/caregiver understands and will follow disposition?: Unsure  Copied from CRM [id number]. Topic: Clinical - Red Word Triage >> Mar 12, 2024  1:16 PM Dewanda Foots wrote: Red Word that prompted transfer to Nurse Triage: Pt states he is having numbness and poor circulation in both left arm and left leg for 2 days.  Also having trouble sleeping Reason for Disposition  Patient sounds very sick or weak to the triager  Answer Assessment - Initial Assessment Questions 1. SYMPTOM: What is the main symptom you are concerned about? (e.g., weakness, numbness)     Numbness from L elbow to finger tips, L hamstring area, to the knee 2. ONSET: When did this start? (minutes, hours, days; while sleeping)     2 days 3. LAST NORMAL: When was the last time you (the patient) were normal (no symptoms)?     2 days ago 4. PATTERN Does this come and go, or has it been constant since it started?  Is it present now?     Pt states that it has been intermittent until 2 days ago, constant since then 5. CARDIAC SYMPTOMS: Have you had any of the following symptoms: chest pain, difficulty breathing, palpitations?     SOB x2 days ago 6. NEUROLOGIC SYMPTOMS: Have you had any of the following symptoms: headache, dizziness, vision loss, double vision, changes in speech, unsteady on your feet?     Denies  Pt states that he has had intermittent numbness to his L arm and L leg. Pt states that for 2 days this has been constant. L elbow to finger tips, L hamstring both feel as though they are asleep. Pt states that he has also had difficulty breathing in the past 2 days. Pt states that he has been trying  to wean off my seizure medication, I know I had a seizure early Monday morning I woke up on the floor in vomit with bruises on my face from my broken reading glasses. Pt advised to utilize the ED, unsure at time of call if he would. Denies vision/speech changes.  Protocols used: Neurologic Deficit-A-AH

## 2024-03-12 NOTE — Telephone Encounter (Signed)
 Spoke to patient and he reports he went to [facility], was told by a patient waiting she had been there 9 hours and he left Patient was told pcp still advises for ed evaluation. He wa told to try [facility] ed.  He verbalized understanding.

## 2024-03-13 ENCOUNTER — Other Ambulatory Visit: Payer: Self-pay | Admitting: Neurology

## 2024-04-22 ENCOUNTER — Other Ambulatory Visit: Payer: Self-pay

## 2024-04-22 DIAGNOSIS — G40209 Localization-related (focal) (partial) symptomatic epilepsy and epileptic syndromes with complex partial seizures, not intractable, without status epilepticus: Secondary | ICD-10-CM

## 2024-04-22 MED ORDER — LEVETIRACETAM 100 MG/ML PO SOLN: ORAL | 0 refills | Status: DC

## 2024-06-02 ENCOUNTER — Other Ambulatory Visit: Payer: Self-pay | Admitting: *Deleted

## 2024-06-02 DIAGNOSIS — G40209 Localization-related (focal) (partial) symptomatic epilepsy and epileptic syndromes with complex partial seizures, not intractable, without status epilepticus: Secondary | ICD-10-CM

## 2024-06-02 MED ORDER — LEVETIRACETAM 100 MG/ML PO SOLN: ORAL | 0 refills | Status: AC

## 2024-08-06 ENCOUNTER — Encounter: Payer: Self-pay | Admitting: Neurology

## 2024-10-01 ENCOUNTER — Encounter (HOSPITAL_BASED_OUTPATIENT_CLINIC_OR_DEPARTMENT_OTHER): Payer: Self-pay

## 2024-10-01 ENCOUNTER — Other Ambulatory Visit: Payer: Self-pay

## 2024-10-01 ENCOUNTER — Emergency Department (HOSPITAL_BASED_OUTPATIENT_CLINIC_OR_DEPARTMENT_OTHER)
Admission: EM | Admit: 2024-10-01 | Discharge: 2024-10-01 | Disposition: A | Discharge status: Home / Self Care | Attending: Emergency Medicine | Admitting: Emergency Medicine

## 2024-10-01 DIAGNOSIS — H8112 Benign paroxysmal vertigo, left ear: Secondary | ICD-10-CM | POA: Diagnosis not present

## 2024-10-01 DIAGNOSIS — R42 Dizziness and giddiness: Secondary | ICD-10-CM | POA: Diagnosis present

## 2024-10-01 DIAGNOSIS — H8110 Benign paroxysmal vertigo, unspecified ear: Secondary | ICD-10-CM

## 2024-10-01 DIAGNOSIS — Z79899 Other long term (current) drug therapy: Secondary | ICD-10-CM | POA: Insufficient documentation

## 2024-10-01 MED ORDER — MECLIZINE HCL 25 MG PO TABS: 25.0000 mg | ORAL_TABLET | Freq: Three times a day (TID) | ORAL | 0 refills | Status: AC | PRN

## 2024-10-01 NOTE — ED Provider Notes (Signed)
 " [place] [facility] Provider Note   CSN: [id number] Arrival date & time: 10/01/24  1511     Patient presents with: Ear Fullness   [name] is a 59 y.o. male.  59 year old male presents emergency department with complaints of intermittent vertigo due to positional changes.  Patient reports symptoms have been going on for approximately 2 weeks following sinus congestion/cold.  He has been taking Alka-Seltzer plus daily for symptoms and reports symptoms have almost completely resolved.  He also has recent surgery for septal deviation approximately 2 months ago.  He reports he had significant congestion following well.  He reports these episodes of vertigo have happened rolling over in bed during sleep that will last for a few seconds and resolve and then also getting up in the morning that resolves after a few seconds.  He has not have any episodes during the day while working and he reports having a strenuous job requiring constant movement.  He does not have any associated chest pain shortness of breath syncope or visual changes with these episodes.  He went to his ENT doctor yesterday and they scheduled him an appointment for Thursday for same.  Patient has significant history of alcohol use disorder which he reports being clean for over 3 months and also seizure disorder for which he is on Keppra  2 times daily for.  Patient has not had a seizure since October.      Prior to Admission medications  Medication Sig Start Date End Date Taking? Authorizing Provider  meclizine  (ANTIVERT ) 25 MG tablet Take 1 tablet (25 mg total) by mouth 3 (three) times daily as needed for dizziness. 10/01/24  Yes Myriam Fonda RAMAN, PA-C  diazePAM , 20 MG Dose, (VALTOCO  20 MG DOSE) 2 x 10 MG/0.1ML LQPK One spray of each device into each nostril as needed for seizure. May use second dose in 4 hours. 02/28/23   Georjean Darice HERO, MD  dutasteride  (AVODART ) 0.5 MG capsule Take 1 capsule  (0.5 mg total) by mouth daily. 08/14/23   Cyndi Shaver, PA-C  ferrous sulfate 324 MG TBEC Take 324 mg by mouth.    [provider]  levETIRAcetam  (KEPPRA  XR) 750 MG 24 hr tablet Take 1 tablet every morning 10/25/23   Aquino, Karen M, MD  levETIRAcetam  (KEPPRA ) 100 MG/ML solution TAKE  (CC) (1,500MG  TOTAL) BY MOUTH ONCE DAILY AT NIGHT 06/02/24   Georjean Darice HERO, MD  methocarbamol  (ROBAXIN ) 500 MG tablet Take 1 tablet (500 mg total) by mouth 2 (two) times daily as needed. 03/12/24   Bernis Ernst, PA-C  Minoxidil  5 % FOAM Apply 1/2 a capful twice daily 08/13/23   Drubel, Shaver, PA-C  Multiple Vitamin (MULITIVITAMIN WITH MINERALS) TABS Take 1 tablet by mouth daily.    [provider]    Allergies: Patient has no known allergies.    Review of Systems  Neurological:  Positive for dizziness.  All other systems reviewed and are negative.   Updated Vital Signs BP 118/81 (BP Location: Right Arm)   Pulse 100   Temp 97.7 F (36.5 C)   Resp 18   Wt 77.1 kg   SpO2 100%   BMI 25.85 kg/m   Physical Exam Vitals and nursing note reviewed.  Constitutional:      Appearance: Normal appearance.  HENT:     Head: Normocephalic and atraumatic.     Right Ear: Tympanic membrane and ear canal normal. There is no impacted cerumen.  Left Ear: Tympanic membrane and ear canal normal. There is no impacted cerumen.     Ears:     Comments: Mild earwax noted in canals.  Patient is requesting ear irrigation.    Nose: Nose normal.     Comments: Nares are without erythema or edema and clear. Eyes:     Extraocular Movements: Extraocular movements intact.     Conjunctiva/sclera: Conjunctivae normal.     Pupils: Pupils are equal, round, and reactive to light.  Cardiovascular:     Rate and Rhythm: Normal rate.     Heart sounds: Normal heart sounds.  Pulmonary:     Effort: Pulmonary effort is normal. No respiratory distress.     Breath sounds: Normal breath sounds.  Abdominal:      General: Abdomen is flat.     Tenderness: There is no guarding.  Musculoskeletal:        General: Normal range of motion.     Cervical back: Normal range of motion.  Skin:    General: Skin is warm.     Capillary Refill: Capillary refill takes less than 2 seconds.  Neurological:     General: No focal deficit present.     Mental Status: He is alert and oriented to person, place, and time.     Cranial Nerves: No cranial nerve deficit.     Motor: No weakness.     Coordination: Coordination normal.     Gait: Gait normal.  Psychiatric:        Mood and Affect: Mood normal.        Behavior: Behavior normal.     (all labs ordered are listed, but only abnormal results are displayed) Labs Reviewed - No data to display  EKG: None  Radiology: No results found.   Procedures   Medications Ordered in the ED - No data to display  59 y.o. male presents to the ED with complaints of intermittent positional vertigo, The differential diagnosis includes BPPV,  sinus congestion, cerumen impaction, TM perforation, otitis media/externa (Ddx)  On arrival pt is nontoxic, vitals unremarkable  ED Course:   Patient is sitting comfortably in ED bed in no acute distress nontoxic-appearing.  Patient is requesting ear cleaning with fluids.  Ears were cleaned and the small amount of wax that was throughout his canals were resolved.  Patient was able to stand walk and move around without any incidents of dizziness. Patient reports he has not experienced any dizziness since this morning after getting out of bed.  No episodes of dizziness during entire ED stay.  Patient's symptoms seem consistent with positional vertigo.  He already has established ENT appointment next Thursday.  Patient was given meclizine  to use as needed for dizziness.  Patient was given strict return precautions for any worsening symptoms.  Patient agreed with treatment plan at this time is comfortable with discharge.   Portions of this  note were generated with Scientist, clinical (histocompatibility and immunogenetics). Dictation errors may occur despite best attempts at proofreading.   Final diagnoses:  Benign paroxysmal positional vertigo, unspecified laterality    ED Discharge Orders          Ordered    meclizine  (ANTIVERT ) 25 MG tablet  3 times daily PRN        10/01/24 1612               Myriam Fonda GORMAN DEVONNA 10/01/24 TRENNA Doretha Folks, MD 10/04/24 1323  "

## 2024-10-01 NOTE — ED Triage Notes (Signed)
 Pt arrives with c/o left ear fullness that started about a week ago. Pt reports intermittent dizziness when he bends down and rolls over in the bed. Pt did have sinus surgery about 2 months. Pt reports having a similar episode like this before and has his ear flushed and made it better.

## 2024-10-01 NOTE — Discharge Instructions (Addendum)
 Your symptoms are consistent with benign positional vertigo.  I have provided you with meclizine  to take 3 times daily as needed for dizziness.  If your dizziness resolves on its own and is not bothersome you can withhold the medicine until it does become bothersome.  Please follow-up with your ENT at your established appointment on Thursday.  Monitor for any worsening symptoms including fainting, visual loss, severe headaches, falls, or vertigo that does not go away.  If any of these or other concerning symptoms arise please return to the emergency department for further evaluation.

## 2024-10-09 ENCOUNTER — Other Ambulatory Visit: Payer: Self-pay

## 2024-10-09 ENCOUNTER — Other Ambulatory Visit (HOSPITAL_BASED_OUTPATIENT_CLINIC_OR_DEPARTMENT_OTHER): Payer: Self-pay

## 2024-10-09 ENCOUNTER — Encounter (HOSPITAL_BASED_OUTPATIENT_CLINIC_OR_DEPARTMENT_OTHER): Payer: Self-pay | Admitting: Emergency Medicine

## 2024-10-09 ENCOUNTER — Emergency Department (HOSPITAL_BASED_OUTPATIENT_CLINIC_OR_DEPARTMENT_OTHER): Admission: EM | Admit: 2024-10-09 | Discharge: 2024-10-10 | Disposition: A | Discharge status: Home / Self Care

## 2024-10-09 DIAGNOSIS — R040 Epistaxis: Secondary | ICD-10-CM | POA: Insufficient documentation

## 2024-10-09 LAB — CBC WITH DIFFERENTIAL/PLATELET
Abs Immature Granulocytes: 0.03 K/uL (ref 0.00–0.07)
Basophils Absolute: 0 K/uL (ref 0.0–0.1)
Basophils Relative: 0 %
Eosinophils Absolute: 0.1 K/uL (ref 0.0–0.5)
Eosinophils Relative: 1 %
HCT: 37.2 % — ABNORMAL LOW (ref 39.0–52.0)
Hemoglobin: 12.6 g/dL — ABNORMAL LOW (ref 13.0–17.0)
Immature Granulocytes: 1 %
Lymphocytes Relative: 31 %
Lymphs Abs: 2 K/uL (ref 0.7–4.0)
MCH: 30.7 pg (ref 26.0–34.0)
MCHC: 33.9 g/dL (ref 30.0–36.0)
MCV: 90.7 fL (ref 80.0–100.0)
Monocytes Absolute: 0.7 K/uL (ref 0.1–1.0)
Monocytes Relative: 11 %
Neutro Abs: 3.6 K/uL (ref 1.7–7.7)
Neutrophils Relative %: 56 %
Platelets: 254 K/uL (ref 150–400)
RBC: 4.1 MIL/uL — ABNORMAL LOW (ref 4.22–5.81)
RDW: 12.4 % (ref 11.5–15.5)
WBC: 6.4 K/uL (ref 4.0–10.5)
nRBC: 0 % (ref 0.0–0.2)

## 2024-10-09 LAB — BASIC METABOLIC PANEL WITH GFR
Anion gap: 9 (ref 5–15)
BUN: 26 mg/dL — ABNORMAL HIGH (ref 6–20)
CO2: 27 mmol/L (ref 22–32)
Calcium: 8.8 mg/dL — ABNORMAL LOW (ref 8.9–10.3)
Chloride: 104 mmol/L (ref 98–111)
Creatinine, Ser: 1.16 mg/dL (ref 0.61–1.24)
GFR, Estimated: >60 mL/min
Glucose, Bld: 116 mg/dL — ABNORMAL HIGH (ref 70–99)
Potassium: 4.1 mmol/L (ref 3.5–5.1)
Sodium: 141 mmol/L (ref 135–145)

## 2024-10-09 MED ORDER — SODIUM CHLORIDE 0.9 % IV BOLUS
1000.0000 mL | Freq: Once | INTRAVENOUS | Status: AC
  Administered 2024-10-09: 1000 mL via INTRAVENOUS

## 2024-10-09 MED ORDER — OXYMETAZOLINE HCL 0.05 % NA SOLN
1.0000 | Freq: Once | NASAL | Status: AC
  Administered 2024-10-09: 1 via NASAL
  Filled 2024-10-09: qty 30

## 2024-10-09 NOTE — ED Provider Notes (Incomplete)
 " [place] [facility] Provider Note   CSN: [id number] Arrival date & time: 10/09/24  2137     Patient presents with: Epistaxis   [name] is a 59 y.o. male.  59 year old male presents emergency department with complaints of nosebleed following a ENT procedure today.  Patient reports he had something removed from his nose today at the ENT and they placed a cottonball in his nose.  He reports he was discharged at that time and went home.  When he took the combo out he said he started bleeding a significant amount from his nose.  He advises since about 4 PM he has been bleeding out of his left nostril.  He reports he decided to go to the emergency department and when he arrived here he reports he got dizzy and had a near syncopal event.       Prior to Admission medications  Medication Sig Start Date End Date Taking? Authorizing Provider  diazePAM , 20 MG Dose, (VALTOCO  20 MG DOSE) 2 x 10 MG/0.1ML LQPK One spray of each device into each nostril as needed for seizure. May use second dose in 4 hours. 02/28/23   Georjean Darice HERO, MD  dutasteride  (AVODART ) 0.5 MG capsule Take 1 capsule (0.5 mg total) by mouth daily. 08/14/23   Cyndi Shaver, PA-C  ferrous sulfate 324 MG TBEC Take 324 mg by mouth.    [provider]  levETIRAcetam  (KEPPRA  XR) 750 MG 24 hr tablet Take 1 tablet every morning 10/25/23   Aquino, Karen M, MD  levETIRAcetam  (KEPPRA ) 100 MG/ML solution TAKE  (CC) (1,500MG  TOTAL) BY MOUTH ONCE DAILY AT NIGHT 06/02/24   Georjean Darice HERO, MD  meclizine  (ANTIVERT ) 25 MG tablet Take 1 tablet (25 mg total) by mouth 3 (three) times daily as needed for dizziness. 10/01/24   Myriam Fonda RAMAN, PA-C  methocarbamol  (ROBAXIN ) 500 MG tablet Take 1 tablet (500 mg total) by mouth 2 (two) times daily as needed. 03/12/24   Bernis Ernst, PA-C  Minoxidil  5 % FOAM Apply 1/2 a capful twice daily 08/13/23   Drubel, Shaver, PA-C  Multiple Vitamin (MULITIVITAMIN  WITH MINERALS) TABS Take 1 tablet by mouth daily.    [provider]    Allergies: Patient has no known allergies.    Review of Systems  HENT:  Positive for nosebleeds.   All other systems reviewed and are negative.   Updated Vital Signs BP 114/88 (BP Location: Right Arm)   Pulse 83   Temp 98.2 F (36.8 C) (Oral)   Resp 16   Ht 5' 8 (1.727 m)   Wt 77.1 kg   SpO2 95%   BMI 25.85 kg/m   Physical Exam Vitals and nursing note reviewed.  Constitutional:      Appearance: Normal appearance.  HENT:     Head: Normocephalic and atraumatic.     Nose: Nose normal.     Comments: Patient has gauze pack to the left nostril, on visualized portion of the gauze there is no blood.    Mouth/Throat:     Comments: Some blood noted in the posterior portion of the oropharynx. Eyes:     Extraocular Movements: Extraocular movements intact.     Conjunctiva/sclera: Conjunctivae normal.     Pupils: Pupils are equal, round, and reactive to light.  Cardiovascular:     Rate and Rhythm: Normal rate.  Pulmonary:     Effort: Pulmonary effort is normal. No respiratory distress.  Breath sounds: Normal breath sounds.  Abdominal:     General: Abdomen is flat. There is no distension.     Tenderness: There is no abdominal tenderness. There is no guarding.  Musculoskeletal:        General: Normal range of motion.     Cervical back: Normal range of motion.  Skin:    General: Skin is warm.     Capillary Refill: Capillary refill takes less than 2 seconds.  Neurological:     General: No focal deficit present.     Mental Status: He is alert.  Psychiatric:        Mood and Affect: Mood normal.        Behavior: Behavior normal.     (all labs ordered are listed, but only abnormal results are displayed) Labs Reviewed - No data to display  EKG: None  Radiology: No results found.   Procedures   Medications Ordered in the ED - No data to display  59 y.o. male presents to the ED with  complaints of epistaxis following ENT procedure, The differential diagnosis includes laceration, epistaxis, anemia, arrhythmia, (Ddx)  On arrival pt is nontoxic, vitals unremarkable, during her syncopal event they did note hypotension.. Exam significant for dried blood noted in the right nare, blood in the oropharynx, and gauze placed in the left nostril with no blood noted to gauze  Additional history obtained from chart review significant for patient having a synechia removed to ENT today.  I ordered medication *** for ***  Lab Tests:  CBC BMP  Imaging Studies ordered:  I ordered imaging studies which included ***, I independently visualized and interpreted imaging which showed ***  ED Course:   Patient is sitting comfortably in ED bed in no acute distress nontoxic-appearing on initial evaluation.  Patient does have a towel in front of him that has significant mount of blood on it from nosebleed prior.  No bleeding noted to gauze placed in patient's nose.  Patient did become dizzy when I set him up in the ED bed, symptoms resolved when laying flat.  Will obtain CBC and give patient a liter of fluid.  On reevaluation patient is sitting comfortably in ED bed.  Patient denies any pain.  Gauze was removed and there is some active bleeding noted from the left nostril not on the right.  Patient will be given Afrin at this time and reevaluated.  After evaluation patient was still not actively bleeding.  Will observe a little bit longer before discharge.   Portions of this note were generated with Scientist, clinical (histocompatibility and immunogenetics). Dictation errors may occur despite best attempts at proofreading.   Final diagnoses:  None    ED Discharge Orders     None        "

## 2024-10-09 NOTE — ED Notes (Signed)
 Called lab to run CBC and BMP off of blood samples sent to lab previously. Per lab, they will put this into process now.

## 2024-10-09 NOTE — Progress Notes (Signed)
 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  Taken down under local anesthesia.  Tolerated well.  Follow-up as needed.  HPI Chief Complaint  Patient presents with   left ear pain    Need to be evaluated    Procedure: Procedures  Alm Ada, MD

## 2024-10-09 NOTE — ED Triage Notes (Signed)
 Patient coming to ED for nose bleed.  Reports he was seen here on the 7th for nose bleed.  Went to ENT today and they did some kind of procedure.  Having bleeding since 4 PM.

## 2024-10-09 NOTE — ED Provider Notes (Signed)
 "  EM[facility] DEPARTMENT AT [facility] Provider Note   CSN: [id number] Arrival date & time: 10/09/24  2137     Patient presents with: Epistaxis   [name] is a 59 y.o. male.  59 year old male presents emergency department with complaints of nosebleed following a ENT procedure today.  Patient reports he had something removed from his nose today at the ENT and they placed a cottonball in his nose.  He reports he was discharged at that time and went home.  When he took the cotton ball out he said he started bleeding a significant amount from his nose.  He advises since about 4 PM he has been bleeding out of his left nostril.  He reports he decided to go to the emergency department and when he arrived here he reports he got dizzy and had a near syncopal event.  Patient did not have any true syncopal event.  Patient denies shortness of breath, vomiting, visual disturbances, chest pain.     Prior to Admission medications  Medication Sig Start Date End Date Taking? Authorizing Provider  diazePAM , 20 MG Dose, (VALTOCO  20 MG DOSE) 2 x 10 MG/0.1ML LQPK One spray of each device into each nostril as needed for seizure. May use second dose in 4 hours. 02/28/23   Georjean Darice HERO, MD  dutasteride  (AVODART ) 0.5 MG capsule Take 1 capsule (0.5 mg total) by mouth daily. 08/14/23   Cyndi Shaver, PA-C  ferrous sulfate 324 MG TBEC Take 324 mg by mouth.    [provider]  levETIRAcetam  (KEPPRA  XR) 750 MG 24 hr tablet Take 1 tablet every morning 10/25/23   Aquino, Karen M, MD  levETIRAcetam  (KEPPRA ) 100 MG/ML solution TAKE  (CC) (1,500MG  TOTAL) BY MOUTH ONCE DAILY AT NIGHT 06/02/24   Georjean Darice HERO, MD  meclizine  (ANTIVERT ) 25 MG tablet Take 1 tablet (25 mg total) by mouth 3 (three) times daily as needed for dizziness. 10/01/24   Myriam Fonda RAMAN, PA-C  methocarbamol  (ROBAXIN ) 500 MG tablet Take 1 tablet (500 mg total) by mouth 2 (two) times daily as needed. 03/12/24   Bernis Ernst, PA-C  Minoxidil  5 % FOAM Apply 1/2 a capful twice daily 08/13/23   Drubel, Shaver, PA-C  Multiple Vitamin (MULITIVITAMIN WITH MINERALS) TABS Take 1 tablet by mouth daily.    [provider]    Allergies: Patient has no known allergies.    Review of Systems  HENT:  Positive for nosebleeds.   All other systems reviewed and are negative.   Updated Vital Signs BP 114/88 (BP Location: Right Arm)   Pulse 83   Temp 98.2 F (36.8 C) (Oral)   Resp 16   Ht 5' 8 (1.727 m)   Wt 77.1 kg   SpO2 95%   BMI 25.85 kg/m   Physical Exam Vitals and nursing note reviewed.  Constitutional:      Appearance: Normal appearance.  HENT:     Head: Normocephalic and atraumatic.     Nose: Nose normal.     Comments: Patient has gauze pack to the left nostril, on visualized portion of the gauze there is no blood.   On reassessment after gauze was removed there was some active areas of bleeding.   On reassessment after Afrin and compression no active bleeding noted in the nasal canal.    Mouth/Throat:     Comments: Some blood noted in the posterior portion of the oropharynx. Eyes:     Extraocular Movements: Extraocular movements  intact.     Conjunctiva/sclera: Conjunctivae normal.     Pupils: Pupils are equal, round, and reactive to light.  Cardiovascular:     Rate and Rhythm: Normal rate.  Pulmonary:     Effort: Pulmonary effort is normal. No respiratory distress.     Breath sounds: Normal breath sounds.  Abdominal:     General: Abdomen is flat. There is no distension.     Tenderness: There is no abdominal tenderness. There is no guarding.  Musculoskeletal:        General: Normal range of motion.     Cervical back: Normal range of motion.  Skin:    General: Skin is warm.     Capillary Refill: Capillary refill takes less than 2 seconds.  Neurological:     General: No focal deficit present.     Mental Status: He is alert.  Psychiatric:        Mood and Affect: Mood normal.         Behavior: Behavior normal.     (all labs ordered are listed, but only abnormal results are displayed) Labs Reviewed  CBC WITH DIFFERENTIAL/PLATELET - Abnormal; Notable for the following components:      Result Value   RBC 4.10 (*)    Hemoglobin 12.6 (*)    HCT 37.2 (*)    All other components within normal limits  BASIC METABOLIC PANEL WITH GFR - Abnormal; Notable for the following components:   Glucose, Bld 116 (*)    BUN 26 (*)    Calcium 8.8 (*)    All other components within normal limits    EKG: None  Radiology: No results found.   Procedures   Medications Ordered in the ED  sodium chloride  0.9 % bolus 1,000 mL (0 mLs Intravenous Stopped 10/09/24 2359)  oxymetazoline  (AFRIN) 0.05 % nasal spray 1 spray (1 spray Each Nare Given 10/09/24 2337)    59 y.o. male presents to the ED with complaints of epistaxis following ENT procedure, The differential diagnosis includes laceration, epistaxis, anemia, arrhythmia, (Ddx)  On arrival pt is nontoxic, vitals unremarkable, during her syncopal event they did note hypotension.. Exam significant for dried blood noted in the right nare, blood in the oropharynx, and gauze placed in the left nostril with no blood noted to gauze  Additional history obtained from chart review significant for patient having a synechia removed to ENT today.  I ordered medication afrin for epistaxis.   Lab Tests:  CBC BMP Hemoglobin stable no other significant abnormalities to the blood work.  ED Course:   Patient is sitting comfortably in ED bed in no acute distress nontoxic-appearing on initial evaluation.  Patient does have a towel in front of him that has significant mount of blood on it from nosebleed prior.  No bleeding noted to gauze placed in patient's nose.  Patient did become dizzy when I set him up in the ED bed, symptoms resolved when laying flat.  Will obtain CBC and give patient a liter of fluid.  On reevaluation patient is sitting  comfortably in ED bed.  Patient denies any pain.  Gauze was removed and there is some active bleeding noted from the left nostril not on the right.  Patient will be given Afrin at this time and reevaluated.  After evaluation patient was still not actively bleeding.  Will observe a little bit longer before discharge.  Patient denies any dizziness and reports feeling much better after the fluid.  No bleeding noted after further observation.  Patient was advised to follow-up with his ENT doctor tomorrow to ensure proper healing from the procedure done in the office today.  Patient was given instructions for treatment of nosebleeds at home and was given Afrin to take home.  Patient was given strict return precautions and was comfortable discharge at this time.  Patient agreed to follow-up with the ENT tomorrow.   Portions of this note were generated with Scientist, clinical (histocompatibility and immunogenetics). Dictation errors may occur despite best attempts at proofreading.   Final diagnoses:  Epistaxis    ED Discharge Orders     None          Myriam Fonda GORMAN DEVONNA 10/10/24 0020    Neysa Caron PARAS, DO 10/10/24 1456  "

## 2024-10-09 NOTE — ED Notes (Signed)
 Patient reported suddenly that room began to spin.  BP rechecked 69/42

## 2024-10-10 NOTE — Discharge Instructions (Signed)
You had a nose bleed which stopped spontaneously. °Nose bleeds can recur however, so please read the instructions below. ° °If the bleeding recurs, please apply direct pressure to the nose for 5 minutes straight, breathing from the mouth and spitting out any blood. °After 5 minutes of holding pressure, if the bleeding continues, do the same thing again for 5 more minutes. °If after 2nd round of holding pressure the bleeding persists - clear the nose and apply afrin spray. After applying afrin spray to the nares, hold pressure again for 5 minutes. °If the bleeding continues despite these measures, then come to the ER while holding direct pressure to the nares. ° °For now - please do not blow your nose, or put fingers in your nose to clear up any clots. ° °

## 2024-10-24 ENCOUNTER — Ambulatory Visit: Payer: Medicaid Other | Admitting: Neurology
